# Patient Record
Sex: Female | Born: 1960 | Hispanic: Yes | Smoking: Never smoker
Health system: Southern US, Community
[De-identification: ages and names within clinical notes are randomized; demographics above are authoritative.]

## PROBLEM LIST (undated history)

## (undated) DIAGNOSIS — I219 Acute myocardial infarction, unspecified: Secondary | ICD-10-CM

## (undated) DIAGNOSIS — I10 Essential (primary) hypertension: Secondary | ICD-10-CM

## (undated) DIAGNOSIS — E079 Disorder of thyroid, unspecified: Secondary | ICD-10-CM

## (undated) DIAGNOSIS — J321 Chronic frontal sinusitis: Secondary | ICD-10-CM

## (undated) HISTORY — PX: BACK SURGERY: SHX140

## (undated) HISTORY — PX: APPENDECTOMY: SHX54

---

## 2017-10-04 ENCOUNTER — Ambulatory Visit
Admission: RE | Admit: 2017-10-04 | Discharge: 2017-10-04 | Disposition: A | Discharge status: Home / Self Care | Payer: Self-pay | Source: Ambulatory Visit | Attending: Oncology | Admitting: Oncology

## 2017-10-04 ENCOUNTER — Other Ambulatory Visit: Payer: Self-pay

## 2017-10-04 ENCOUNTER — Other Ambulatory Visit: Payer: Self-pay | Admitting: Oncology

## 2017-10-04 ENCOUNTER — Ambulatory Visit: Payer: Self-pay | Attending: Oncology | Admitting: *Deleted

## 2017-10-04 ENCOUNTER — Encounter: Payer: Self-pay | Admitting: *Deleted

## 2017-10-04 ENCOUNTER — Encounter (INDEPENDENT_AMBULATORY_CARE_PROVIDER_SITE_OTHER): Payer: Self-pay

## 2017-10-04 VITALS — BP 166/109 | HR 71 | Temp 97.5°F | Ht 62.0 in | Wt 155.0 lb

## 2017-10-04 DIAGNOSIS — Z0000 Encounter for general adult medical examination without abnormal findings: Secondary | ICD-10-CM | POA: Insufficient documentation

## 2017-10-04 IMAGING — MG MM DIGITAL SCREENING BILAT W/ TOMO W/ CAD
6 of 10 series · 6 of 30 positions shown · non-contrast
Comparison: None.

CLINICAL DATA: Screening.

EXAM:
DIGITAL SCREENING BILATERAL MAMMOGRAM WITH TOMO AND CAD

[L MLO synth-2D (1 of 2)]
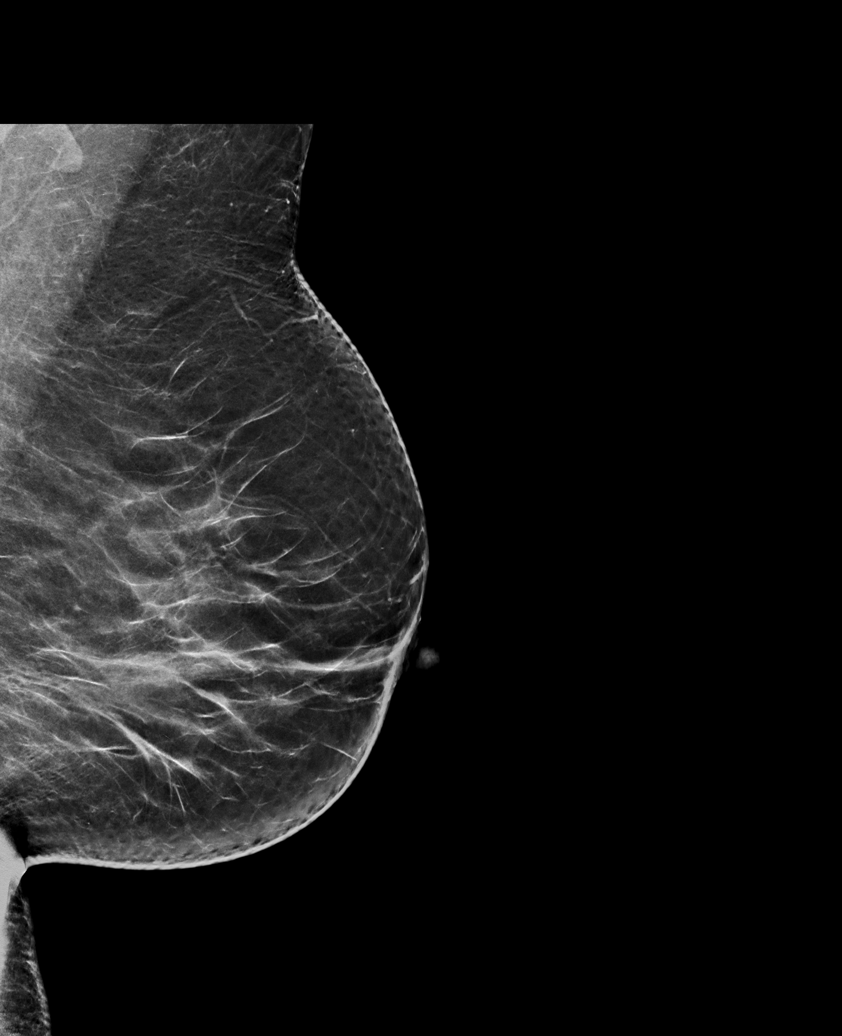

[R CC synth-2D]
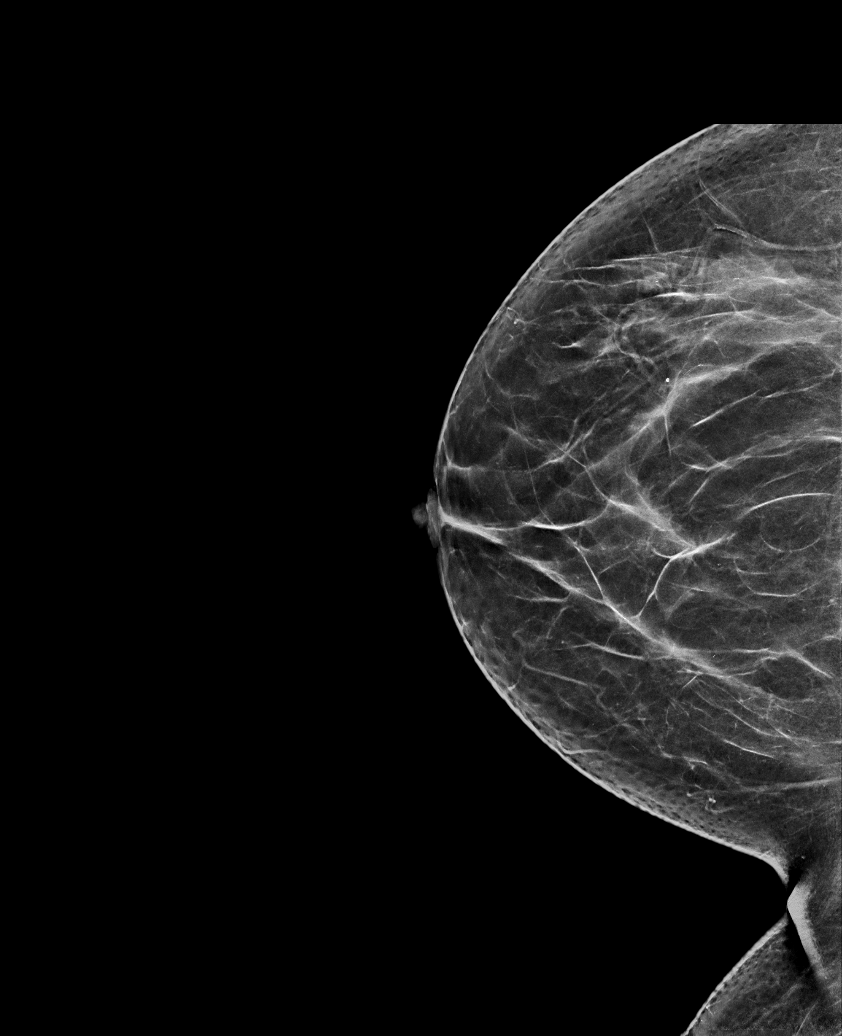

[L CC synth-2D]
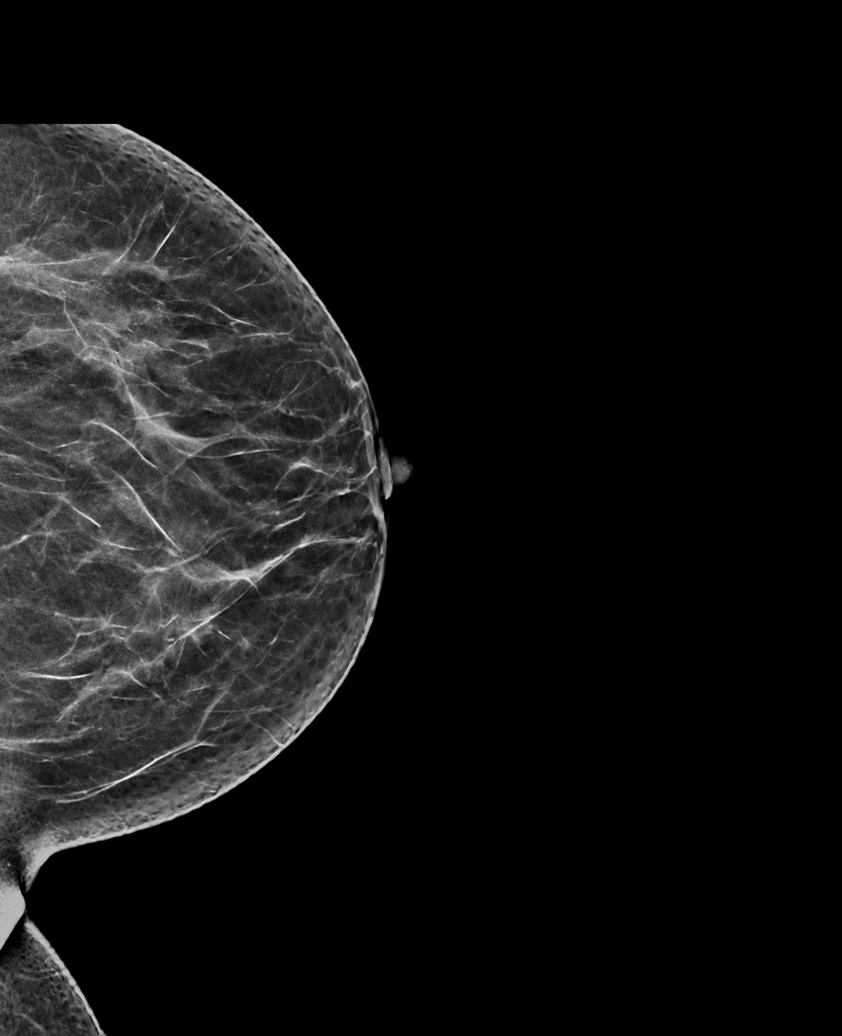

[L MLO synth-2D (2 of 2)]
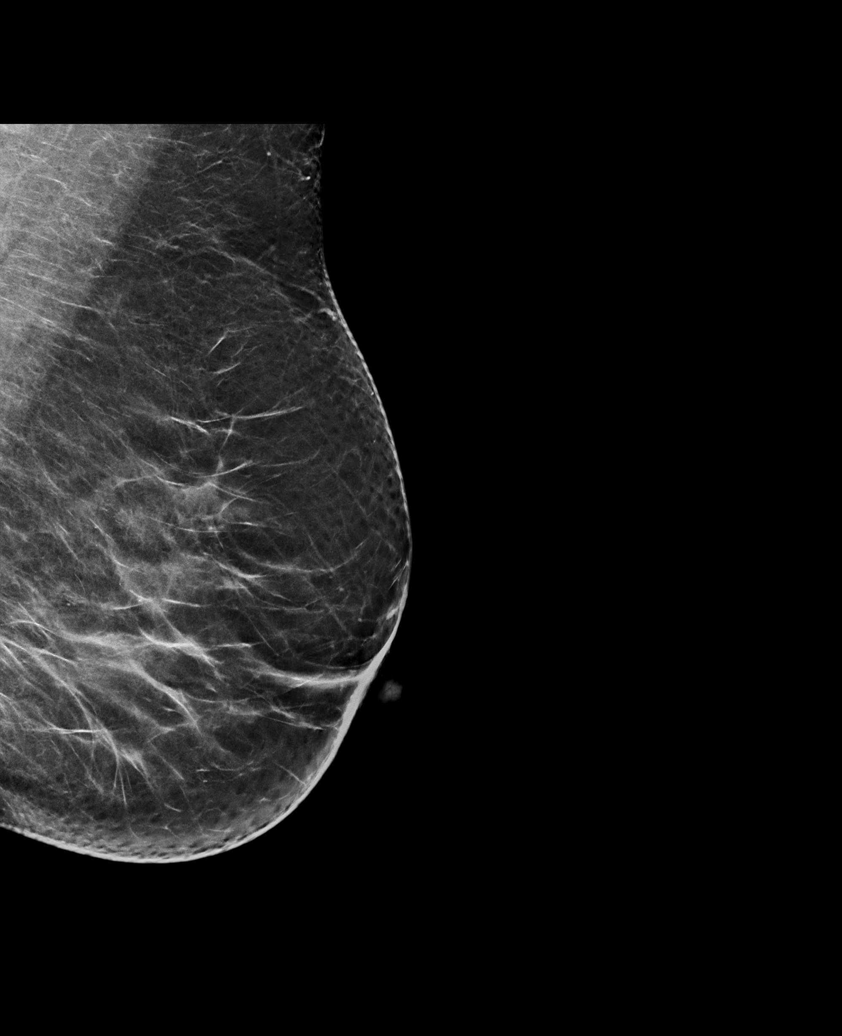

[R MLO synth-2D]
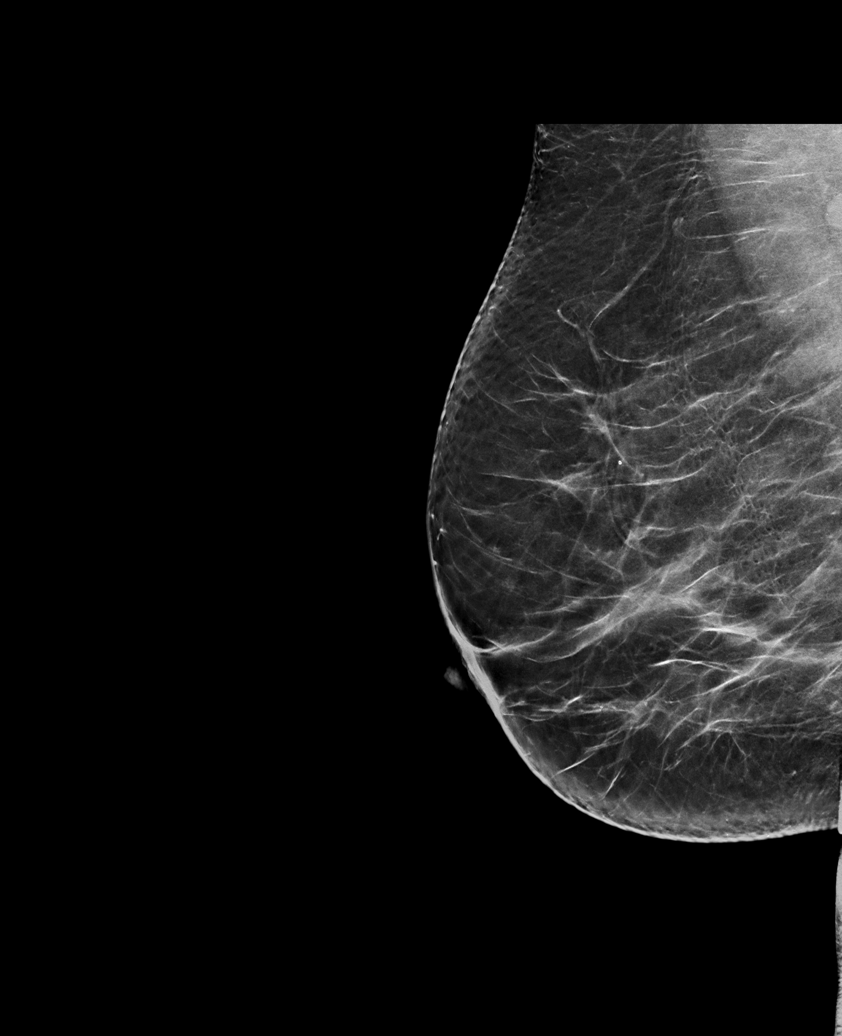

[L MLO tomo · tomo slice 37/74.0]
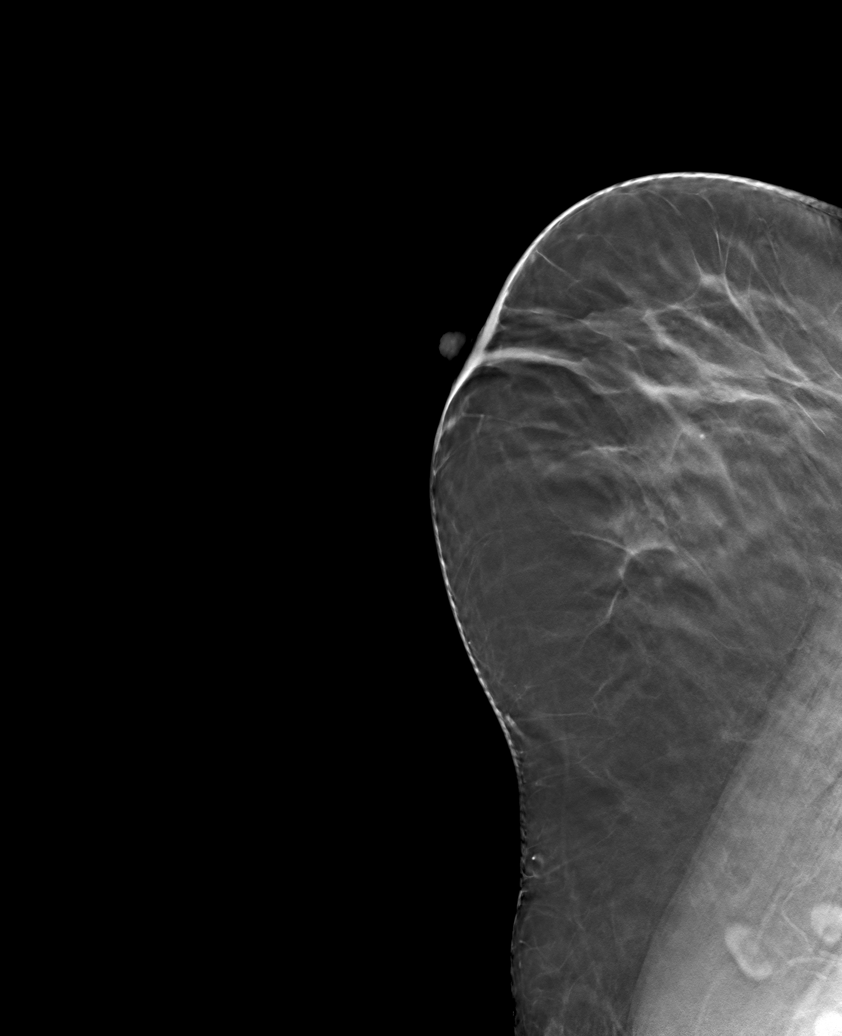

[6 of 30 positions shown; findings below may reference images not displayed]

ACR Breast Density Category c: The breast tissue is heterogeneously
dense, which may obscure small masses
FINDINGS: There are no findings suspicious for malignancy. Images were
processed with CAD.
IMPRESSION: No mammographic evidence of malignancy. A result letter of this
screening mammogram will be mailed directly to the patient.

RECOMMENDATION:
Screening mammogram in one year. (Code:[4W])

BI-RADS CATEGORY  1: Negative.

## 2017-10-04 NOTE — Progress Notes (Signed)
Letter mailed from the [facility] to inform patient of her normal mammogram results.  Patient is to follow-up with annual screening in one year.  HSIS to Christy. 

## 2017-10-04 NOTE — Patient Instructions (Signed)
Gave patient hand-out, Women Staying Healthy, Active and Well from [facility], with education on breast health, pap smears, heart and colon health. 

## 2017-10-04 NOTE — Progress Notes (Signed)
  Subjective:     Patient ID: Linda Clark [address], female   DOB: February 23, 1961, 57 y.o.   MRN: [id number]  HPI   Review of Systems     Objective:   Physical Exam  Pulmonary/Chest: Right breast exhibits no inverted nipple, no mass, no nipple discharge, no skin change and no tenderness. Left breast exhibits no inverted nipple, no mass, no nipple discharge, no skin change and no tenderness.       Assessment:     57 year old [place] female [facility] to [facility] clinical breast exam and mammogram only.  [name], the interpreter present during the interview and exam.  Patient states her last mammogram and pap smear were completed last year in [place].  States they were both normal.  On clinical breast exam there is no dominant mass, skin changes, nipple discharge or lymphadenopathy.  Taught self breast awareness.  Next pap due in 2021 since there is no pap for review.  Blood pressure elevated at  166/109.  She is to recheck her blood pressure at [facility] or [facility], and if remains higher than 140/90 she is to follow-up with her primary care provider.  Hand out on hypertention given to patient.Patient has been screened for eligibility.  She does not have any insurance, Medicare or Medicaid.  She also meets financial eligibility.  Hand-out given on the Affordable Care Act. Risk Assessment    Risk Scores      10/04/2017   Last edited by: Scarlett PrestoShaver, Anne F, RN   5-year risk: 0.8 %   Lifetime risk: 5.8 %             Plan:     Screening mammogram ordered.  Will follow-up per [facility] protocol.

## 2018-10-04 ENCOUNTER — Other Ambulatory Visit: Payer: Self-pay | Admitting: *Deleted

## 2018-10-04 DIAGNOSIS — Z20822 Contact with and (suspected) exposure to covid-19: Secondary | ICD-10-CM

## 2018-10-09 LAB — NOVEL CORONAVIRUS, NAA: SARS-CoV-2, NAA: NOT DETECTED

## 2020-03-25 ENCOUNTER — Ambulatory Visit
Admission: RE | Admit: 2020-03-25 | Discharge: 2020-03-25 | Disposition: A | Discharge status: Home / Self Care | Payer: Self-pay | Source: Ambulatory Visit | Attending: Oncology | Admitting: Oncology

## 2020-03-25 ENCOUNTER — Encounter (INDEPENDENT_AMBULATORY_CARE_PROVIDER_SITE_OTHER): Payer: Self-pay

## 2020-03-25 ENCOUNTER — Other Ambulatory Visit: Payer: Self-pay

## 2020-03-25 ENCOUNTER — Encounter: Payer: Self-pay | Admitting: *Deleted

## 2020-03-25 ENCOUNTER — Ambulatory Visit: Payer: Self-pay | Attending: Oncology | Admitting: *Deleted

## 2020-03-25 VITALS — BP 160/98 | HR 92 | Temp 98.6°F | Resp 20 | Wt 171.2 lb

## 2020-03-25 DIAGNOSIS — Z0000 Encounter for general adult medical examination without abnormal findings: Secondary | ICD-10-CM | POA: Insufficient documentation

## 2020-03-25 IMAGING — MG DIGITAL SCREENING BILAT W/ TOMO W/ CAD
8 series · 8 of 24 positions shown · non-contrast
Comparison: Previous exam(s).

CLINICAL DATA: Screening.

EXAM:
DIGITAL SCREENING BILATERAL MAMMOGRAM WITH TOMO AND CAD

[L MLO synth-2D]
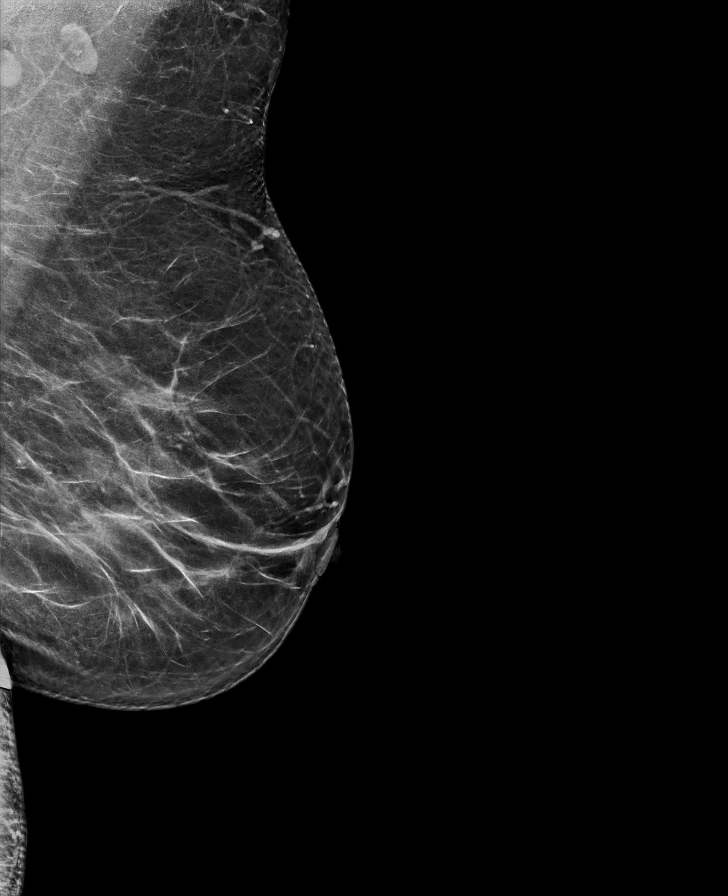

[R MLO synth-2D]
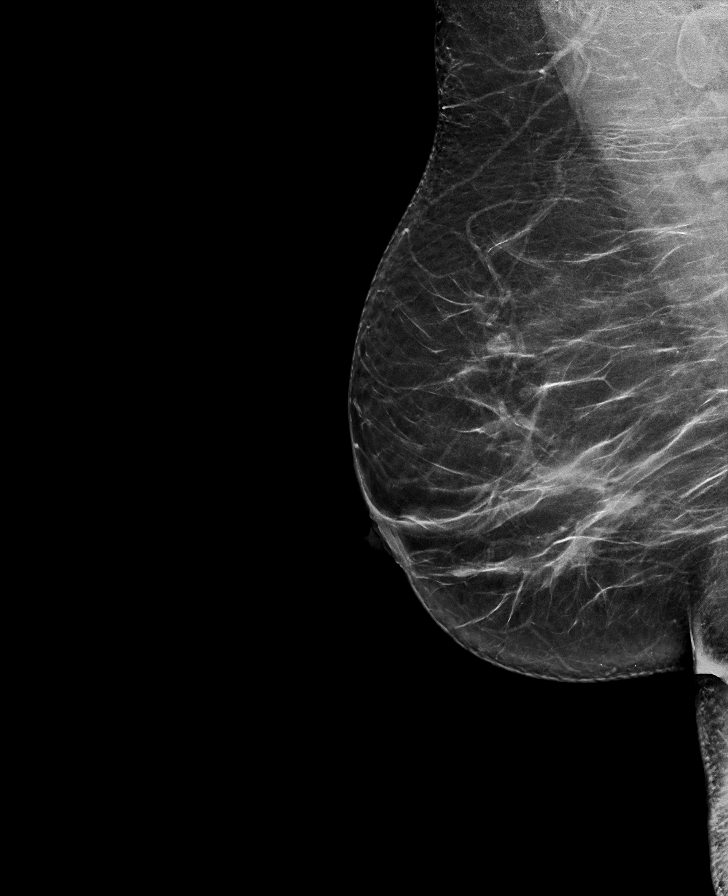

[L CC synth-2D]
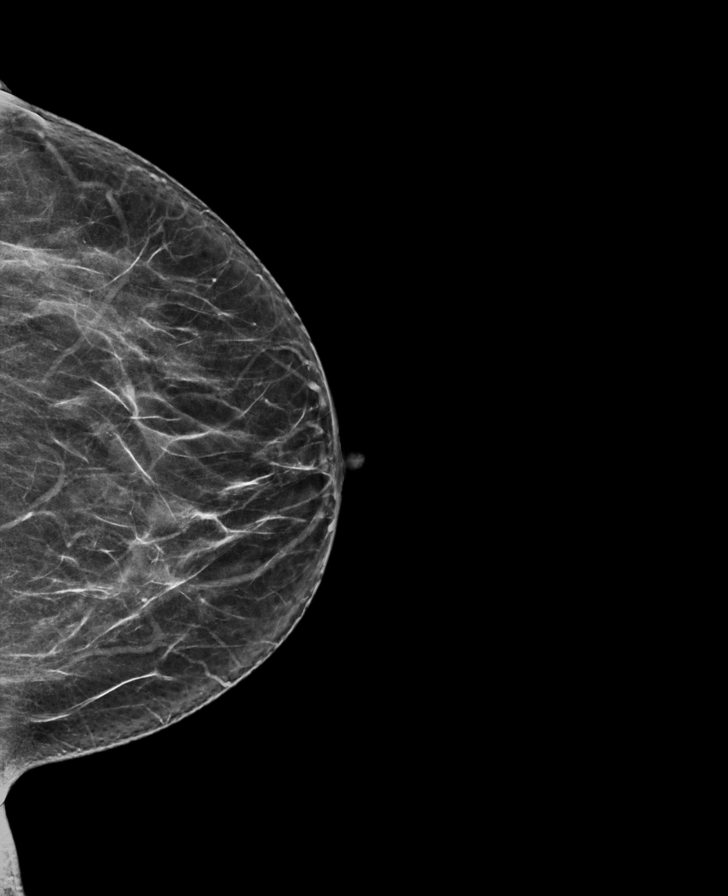

[R CC synth-2D]
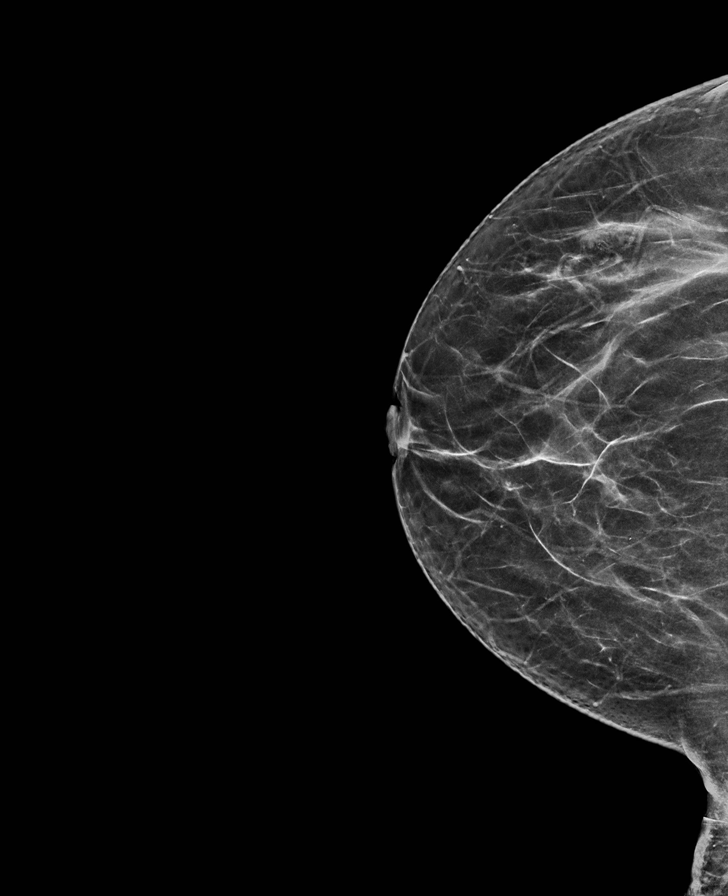

[L MLO tomo · tomo slice 35/70.0]
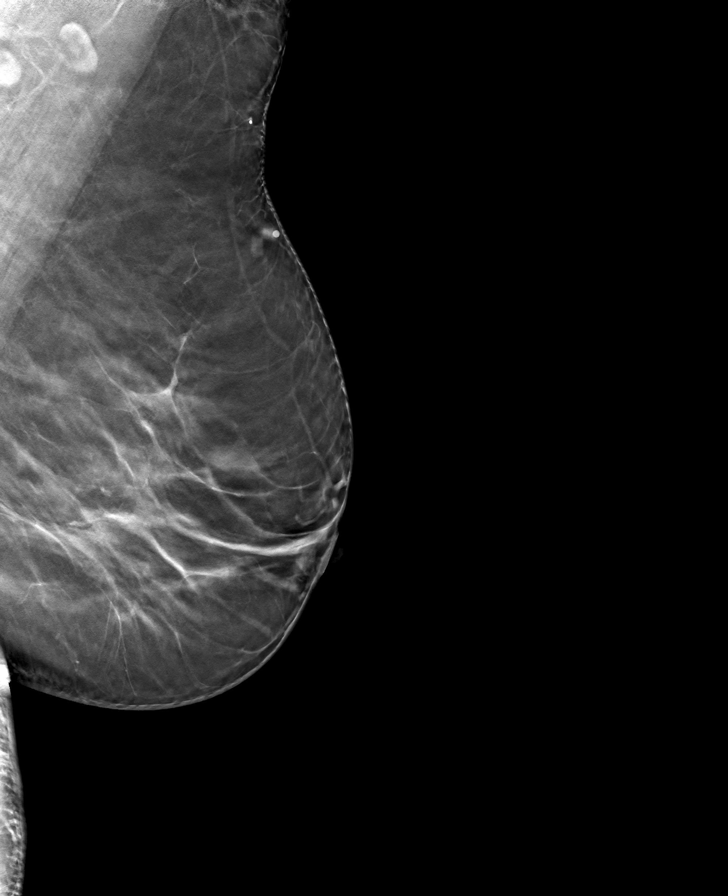

[R CC tomo · tomo slice 33/64.0]
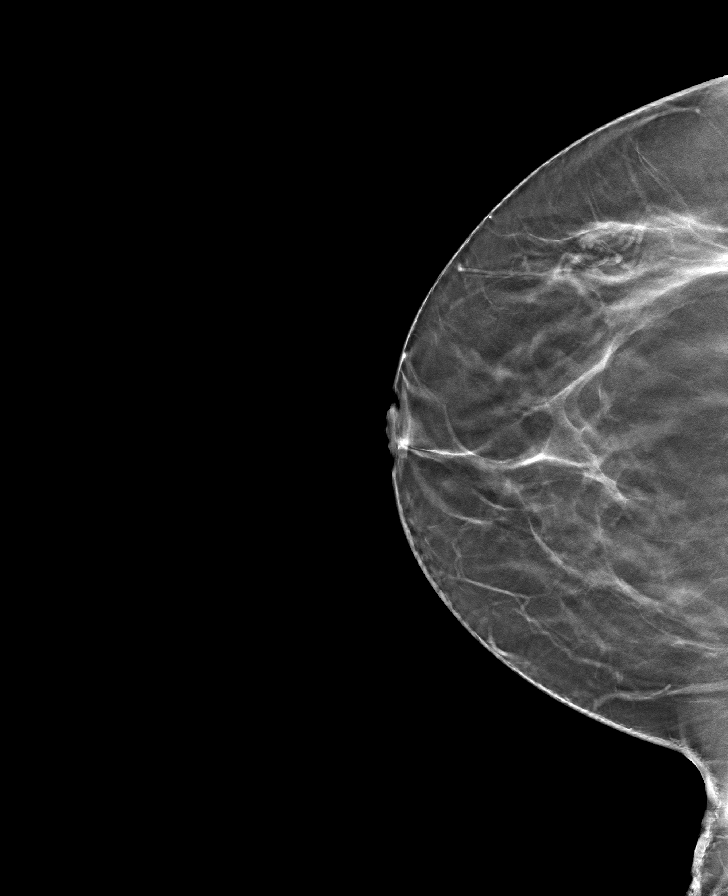

[R MLO tomo · tomo slice 37/74.0]
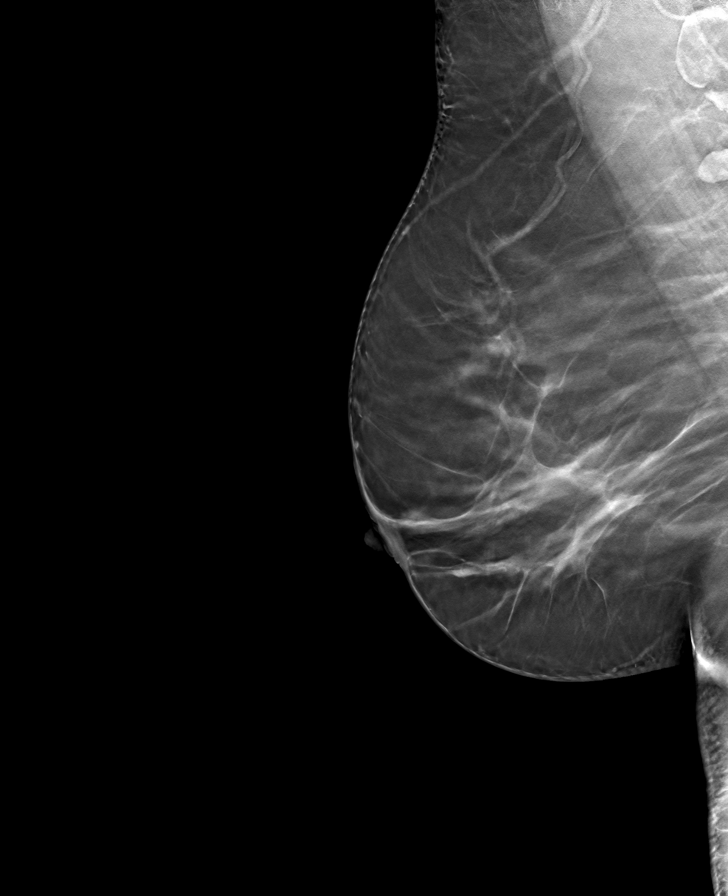

[L CC tomo · tomo slice 34/67.0]
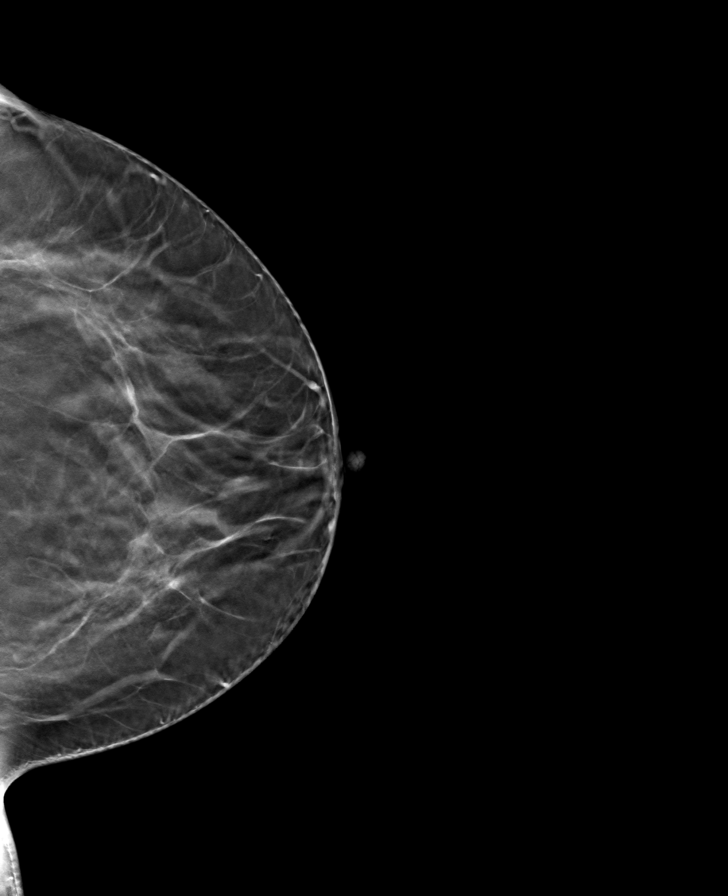

[8 of 24 positions shown; findings below may reference images not displayed]

ACR Breast Density Category b: There are scattered areas of
fibroglandular density.
FINDINGS: There are no findings suspicious for malignancy. Images were
processed with CAD.
IMPRESSION: No mammographic evidence of malignancy. A result letter of this
screening mammogram will be mailed directly to the patient.

RECOMMENDATION:
Screening mammogram in one year. (Code:[TQ])

BI-RADS CATEGORY  1: Negative.

## 2020-03-25 NOTE — Progress Notes (Signed)
  Subjective:     Patient ID: Linda Clark [address] female   DOB: 03/31/1960, 59 y.o.   MRN: [id number]  HPI  [facility] Medical History Record - 03/25/20 0858      Breast History   Screening cycle New    Initial Mammogram 03/25/20    Last Mammogram Date 10/04/17    Recent Breast Symptoms None      Breast Cancer History   Breast Cancer History No personal or family history    Comments/Details aunts abnd cousins on paternal side      Previous History of Breast Problems   Breast Surgery or Biopsy None    Breast Implants N/A    BSE Done Monthly      Gynecological/Obstetrical History   LMP --   2 years ago   Is there any chance that the client could be pregnant?  No    Age at menarche 10    Age at menopause 81    PAP smear history Annually    Date of last PAP  07/02/19    Age at first live birth 64    Breast fed children Yes (type length in comments)   69yrs   Cervical, Uterine or Ovarian cancer Yes   pre cancerous changes   Family history of Cervial, Uterine or Ovarian cancer Yes   sister   Hysterectomy No    Cervix removed No    Ovaries removed No    Laser/Cryosurgery No    Current method of birth control None    Current method of Estrogen/Hormone replacement None    Smoking history None            Review of Systems     Objective:   Physical Exam Chest:  Breasts:     Right: No swelling, bleeding, inverted nipple, mass, nipple discharge, skin change, tenderness, axillary adenopathy or supraclavicular adenopathy.     Left: No swelling, bleeding, inverted nipple, mass, nipple discharge, skin change, tenderness, axillary adenopathy or supraclavicular adenopathy.     Lymphadenopathy:     Upper Body:     Right upper body: No supraclavicular or axillary adenopathy.     Left upper body: No supraclavicular or axillary adenopathy.        Assessment:     59 year old Hispanic female returns to [facility] for annual screening.  Linda Clark [id number] with [employer]  interpreters present via video during the interview and exam.  Clinical breast exam without dominant mass, skin changes, nipple discharge or lymphadenopathy.   Right nipple slightly flatter than the left. Taught self breast awareness.  Last pap in April 2021 at the [facility] clinic.  Those results are not available for review.  Patient has been screened for eligibility.  She does not have any insurance, Medicare or Medicaid.  She also meets financial eligibility.   Risk Assessment   No risk assessment data for the current encounter  Risk Scores      10/04/2017   Last edited by: Scarlett Presto, RN   5-year risk: 0.8 %   Lifetime risk: 5.8 %        Tyrer-Cuzick breast cancer risk assessment with a lifetime risk of 7.8%.  Per NCCN guidelines no modified imaging or genetic testing is recommended.    Plan:     Screening mammogram ordered.  Will follow up per [facility] protocol.

## 2020-03-25 NOTE — Patient Instructions (Signed)
Gave patient hand-out, Women Staying Healthy, Active and Well from [facility], with education on breast health, pap smears, heart and colon health. 

## 2020-04-08 ENCOUNTER — Encounter: Payer: Self-pay | Admitting: *Deleted

## 2020-04-08 NOTE — Progress Notes (Signed)
Letter mailed from the [facility] to inform patient of her normal mammogram results.  Patient is to follow-up with annual screening in one year. 

## 2021-02-17 ENCOUNTER — Emergency Department: Payer: Self-pay

## 2021-02-17 ENCOUNTER — Other Ambulatory Visit: Payer: Self-pay

## 2021-02-17 ENCOUNTER — Emergency Department
Admission: EM | Admit: 2021-02-17 | Discharge: 2021-02-17 | Disposition: A | Discharge status: Home / Self Care | Payer: Self-pay | Attending: Emergency Medicine | Admitting: Emergency Medicine

## 2021-02-17 DIAGNOSIS — I160 Hypertensive urgency: Secondary | ICD-10-CM | POA: Insufficient documentation

## 2021-02-17 DIAGNOSIS — I2510 Atherosclerotic heart disease of native coronary artery without angina pectoris: Secondary | ICD-10-CM | POA: Insufficient documentation

## 2021-02-17 DIAGNOSIS — Z20822 Contact with and (suspected) exposure to covid-19: Secondary | ICD-10-CM | POA: Insufficient documentation

## 2021-02-17 DIAGNOSIS — Z7982 Long term (current) use of aspirin: Secondary | ICD-10-CM | POA: Insufficient documentation

## 2021-02-17 DIAGNOSIS — Y900 Blood alcohol level of less than 20 mg/100 ml: Secondary | ICD-10-CM | POA: Insufficient documentation

## 2021-02-17 DIAGNOSIS — Z79899 Other long term (current) drug therapy: Secondary | ICD-10-CM | POA: Insufficient documentation

## 2021-02-17 DIAGNOSIS — I10 Essential (primary) hypertension: Secondary | ICD-10-CM | POA: Insufficient documentation

## 2021-02-17 HISTORY — DX: Disorder of thyroid, unspecified: E07.9

## 2021-02-17 HISTORY — DX: Chronic frontal sinusitis: J32.1

## 2021-02-17 HISTORY — DX: Essential (primary) hypertension: I10

## 2021-02-17 HISTORY — DX: Acute myocardial infarction, unspecified: I21.9

## 2021-02-17 LAB — URINALYSIS, ROUTINE W REFLEX MICROSCOPIC
Bilirubin Urine: NEGATIVE
Glucose, UA: NEGATIVE mg/dL
Hgb urine dipstick: NEGATIVE
Ketones, ur: NEGATIVE mg/dL
Leukocytes,Ua: NEGATIVE
Nitrite: NEGATIVE
Protein, ur: NEGATIVE mg/dL
Specific Gravity, Urine: 1.034 — ABNORMAL HIGH (ref 1.005–1.030)
pH: 8 (ref 5.0–8.0)

## 2021-02-17 LAB — CSF CELL COUNT WITH DIFFERENTIAL
Eosinophils, CSF: 0 %
Lymphs, CSF: 90 %
Monocyte-Macrophage-Spinal Fluid: 10 %
RBC Count, CSF: 0 /mm3 (ref 0–3)
Segmented Neutrophils-CSF: 0 %
Tube #: 3
WBC, CSF: 3 /mm3 (ref 0–5)

## 2021-02-17 LAB — COMPREHENSIVE METABOLIC PANEL
ALT: 35 U/L (ref 0–44)
AST: 54 U/L — ABNORMAL HIGH (ref 15–41)
Albumin: 4.5 g/dL (ref 3.5–5.0)
Alkaline Phosphatase: 65 U/L (ref 38–126)
Anion gap: 8 (ref 5–15)
BUN: 17 mg/dL (ref 6–20)
CO2: 28 mmol/L (ref 22–32)
Calcium: 9.5 mg/dL (ref 8.9–10.3)
Chloride: 102 mmol/L (ref 98–111)
Creatinine, Ser: 0.55 mg/dL (ref 0.44–1.00)
GFR, Estimated: >60 mL/min (ref >60)
Glucose, Bld: 111 mg/dL — ABNORMAL HIGH (ref 70–99)
Potassium: 4.4 mmol/L (ref 3.5–5.1)
Sodium: 138 mmol/L (ref 135–145)
Total Bilirubin: 0.9 mg/dL (ref 0.3–1.2)
Total Protein: 8 g/dL (ref 6.5–8.1)

## 2021-02-17 LAB — CBC WITH DIFFERENTIAL/PLATELET
Abs Immature Granulocytes: 0.03 10*3/uL (ref 0.00–0.07)
Basophils Absolute: 0.1 10*3/uL (ref 0.0–0.1)
Basophils Relative: 1 %
Eosinophils Absolute: 0.3 10*3/uL (ref 0.0–0.5)
Eosinophils Relative: 3 %
HCT: 49.6 % — ABNORMAL HIGH (ref 36.0–46.0)
Hemoglobin: 16 g/dL — ABNORMAL HIGH (ref 12.0–15.0)
Immature Granulocytes: 0 %
Lymphocytes Relative: 18 %
Lymphs Abs: 1.6 10*3/uL (ref 0.7–4.0)
MCH: 29.9 pg (ref 26.0–34.0)
MCHC: 32.3 g/dL (ref 30.0–36.0)
MCV: 92.5 fL (ref 80.0–100.0)
Monocytes Absolute: 0.6 10*3/uL (ref 0.1–1.0)
Monocytes Relative: 6 %
Neutro Abs: 6.4 10*3/uL (ref 1.7–7.7)
Neutrophils Relative %: 72 %
Platelets: 301 10*3/uL (ref 150–400)
RBC: 5.36 MIL/uL — ABNORMAL HIGH (ref 3.87–5.11)
RDW: 12.8 % (ref 11.5–15.5)
WBC: 8.9 10*3/uL (ref 4.0–10.5)
nRBC: 0 % (ref 0.0–0.2)

## 2021-02-17 LAB — URINE DRUG SCREEN, QUALITATIVE (ARMC ONLY)
Amphetamines, Ur Screen: NOT DETECTED
Barbiturates, Ur Screen: NOT DETECTED
Benzodiazepine, Ur Scrn: NOT DETECTED
Cannabinoid 50 Ng, Ur ~~LOC~~: NOT DETECTED
Cocaine Metabolite,Ur ~~LOC~~: NOT DETECTED
MDMA (Ecstasy)Ur Screen: NOT DETECTED
Methadone Scn, Ur: NOT DETECTED
Opiate, Ur Screen: NOT DETECTED
Phencyclidine (PCP) Ur S: NOT DETECTED
Tricyclic, Ur Screen: NOT DETECTED

## 2021-02-17 LAB — RESP PANEL BY RT-PCR (FLU A&B, COVID) ARPGX2
Influenza A by PCR: NEGATIVE
Influenza B by PCR: NEGATIVE
SARS Coronavirus 2 by RT PCR: NEGATIVE

## 2021-02-17 LAB — PROTEIN AND GLUCOSE, CSF
Glucose, CSF: 65 mg/dL (ref 40–70)
Total  Protein, CSF: 33 mg/dL (ref 15–45)

## 2021-02-17 LAB — APTT: aPTT: 27 seconds (ref 24–36)

## 2021-02-17 LAB — PROTIME-INR
INR: 0.9 (ref 0.8–1.2)
Prothrombin Time: 12.6 seconds (ref 11.4–15.2)

## 2021-02-17 LAB — ETHANOL: Alcohol, Ethyl (B): <10 mg/dL (ref <10)

## 2021-02-17 LAB — TROPONIN I (HIGH SENSITIVITY): Troponin I (High Sensitivity): 15 ng/L (ref <18)

## 2021-02-17 IMAGING — CT CT ANGIO HEAD-NECK (W OR W/O PERF)
3 of 7 series · 10 of 36 positions shown · IV contrast (APPLIED)
Comparison: Same-day noncontrast CT head.

CLINICAL DATA: Same-day noncontrast CT head

EXAM:
CT ANGIOGRAPHY HEAD AND NECK
TECHNIQUE: Multidetector CT imaging of the head and neck was performed using
the standard protocol during bolus administration of intravenous
contrast. Multiplanar CT image reconstructions and MIPs were
obtained to evaluate the vascular anatomy. Carotid stenosis
measurements (when applicable) are obtained utilizing NASCET
criteria, using the distal internal carotid diameter as the
denominator.
CONTRAST:  125mL OMNIPAQUE IOHEXOL 350 MG/ML SOLN

[Series 4: cta head neck · axial · 0.46mm/px · z∈[-206,-102]mm · 2 of 157 slices shown]
[im 53/157  soft-tissue]
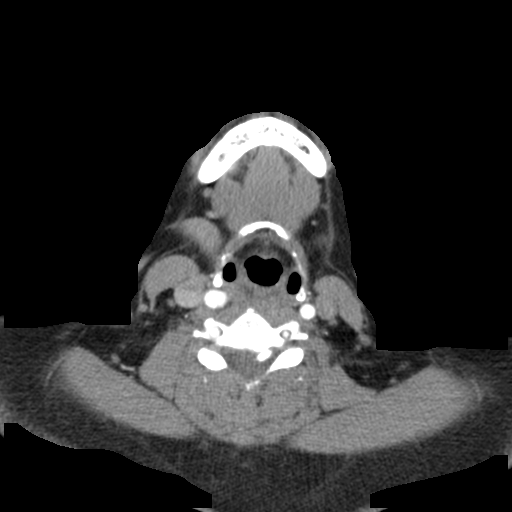
[im 105/157  soft-tissue]
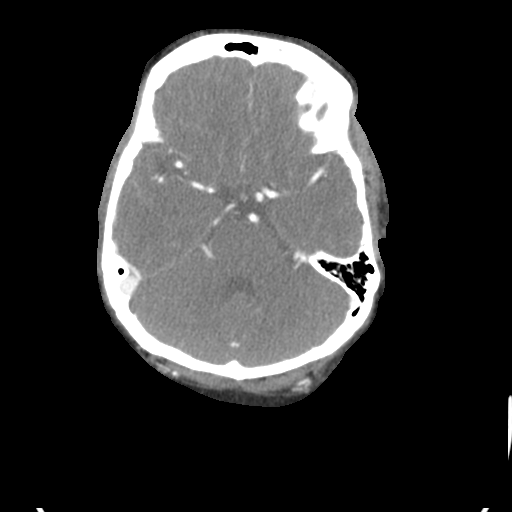

[Series 7: ax thin · axial · 0.53mm/px · z∈[-286,-56]mm · 6 of 323 slices shown]
[im 47/323  soft-tissue]
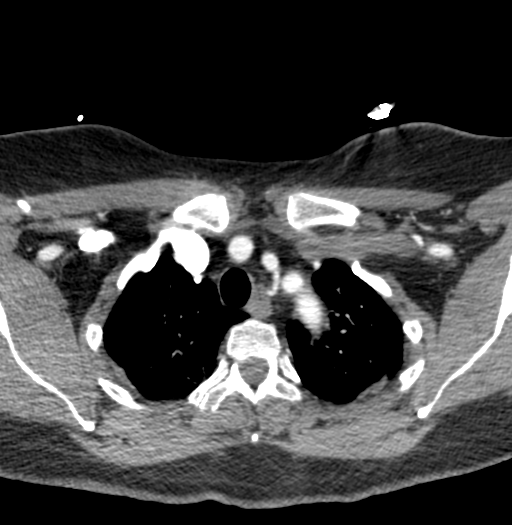
[im 93/323  bone]
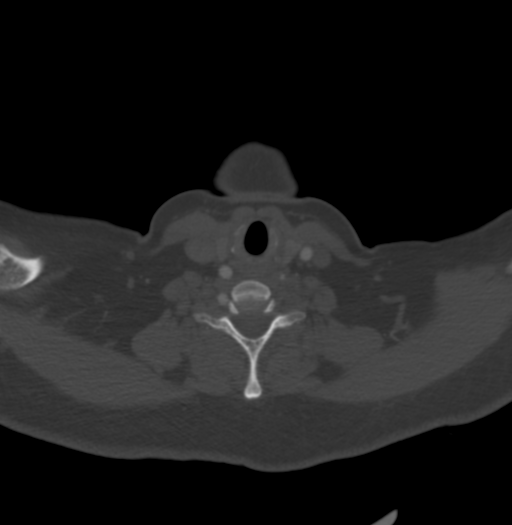
[im 139/323  soft-tissue]
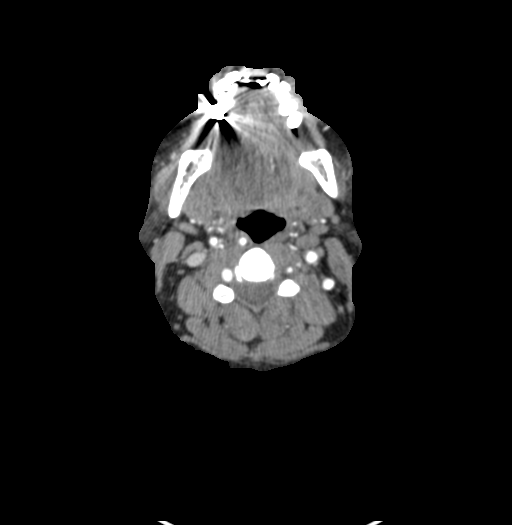
[im 185/323  bone]
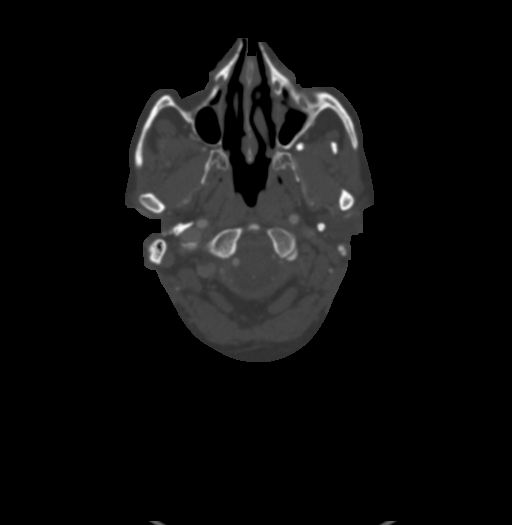
[im 231/323  soft-tissue]
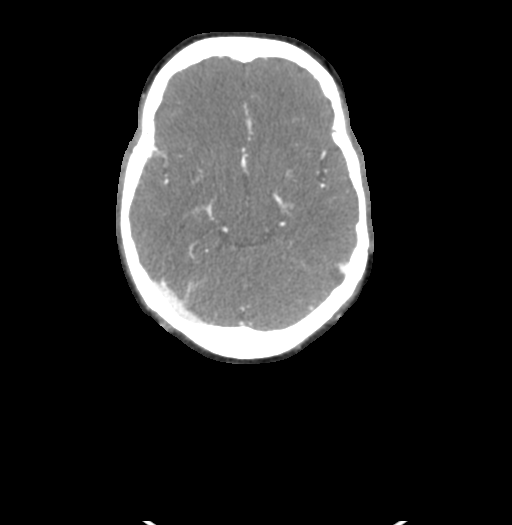
[im 277/323  bone]
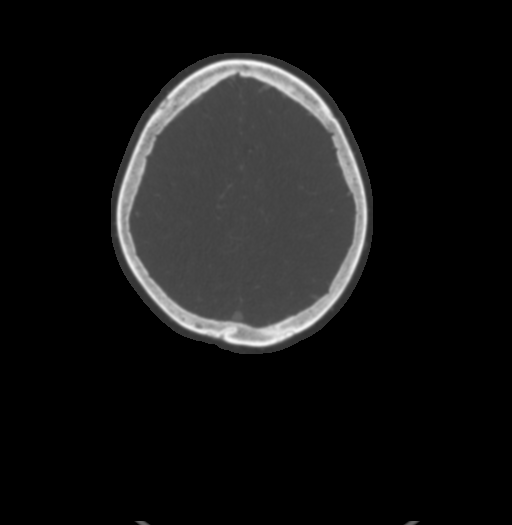

[Series 11: sagittal thin · sagittal · 0.47mm/px · 2 of 271 slices shown]
[im 9/271  soft-tissue]
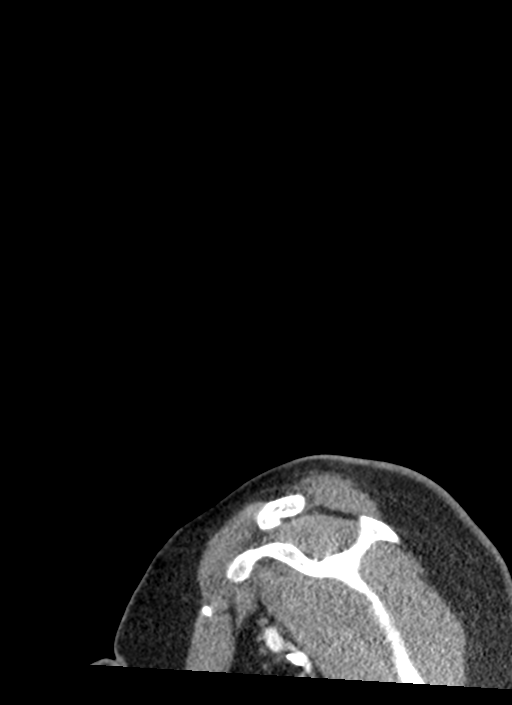
[im 263/271  soft-tissue]
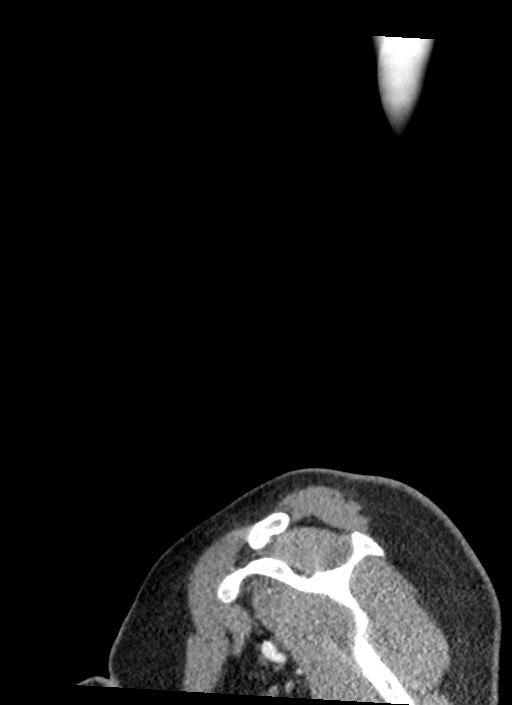

[10 of 36 positions shown; findings below may reference images not displayed]

FINDINGS: CTA NECK FINDINGS

Aortic arch: The left vertebral artery arises directly from the
aortic arch, a normal variant. Imaged portion shows no evidence of
aneurysm or dissection. No significant stenosis of the major arch
vessel origins.

Right carotid system: The right common, internal, and external
carotid arteries are patent, without hemodynamically significant
stenosis, occlusion, dissection, or aneurysm. The right internal
carotid artery has a medialized retropharyngeal course.

Left carotid system: The left common, internal, and external carotid
arteries are patent, without hemodynamically significant stenosis,
occlusion, dissection, or aneurysm.

Vertebral arteries: The right vertebral artery is dominant with a
diminutive left vertebral artery., a normal variant. The vertebral
arteries are patent, without hemodynamically significant stenosis,
occlusion, dissection, or aneurysm.

Skeleton: There is mild degenerative change at C5-C6. There is no
visible canal hematoma. There is no acute osseous abnormality or
aggressive osseous lesion.

Other neck: The soft tissues are unremarkable.

Upper chest: The imaged lung apices are clear.

Review of the MIP images confirms the above findings

CTA HEAD FINDINGS

Anterior circulation: The bilateral cavernous ICAs are patent with
minimal plaque in the left supraclinoid segment.

The bilateral MCAs are patent.

The bilateral ACAs are patent.

There is no aneurysm.

Posterior circulation: The left V4 segment is markedly diminutive
after the PICA origin, likely developmental variant. The prominent
right V4 segment is patent. The basilar artery is patent.

The bilateral PCAs are patent.

There is no aneurysm.

Venous sinuses: Patent.

Anatomic variants: None.

Review of the MIP images confirms the above findings
IMPRESSION: 1. No aneurysm identified.
2. Patent vasculature of the head and neck, with no hemodynamically
significant stenosis, occlusion, or dissection.

## 2021-02-17 IMAGING — CT CT ANGIO CHEST-ABD-PELV FOR DISSECTION W/ AND WO/W CM
2 of 7 series · 11 of 36 positions shown, 17 images · IV contrast (APPLIED)
Comparison: None.

CLINICAL DATA: Hypertension. Small subarachnoid hemorrhage on head
CT.

EXAM:
CT ANGIOGRAPHY CHEST, ABDOMEN AND PELVIS
TECHNIQUE: Non-contrast CT of the chest was initially obtained.

[Series 6: axial arterial · axial · arterial · 0.66mm/px · z∈[-784,-268]mm · 10 of 204 slices shown, 15 images]
[im 16/204  mediastinal]
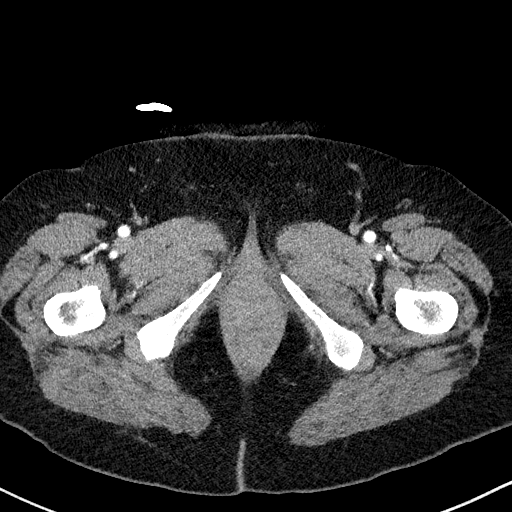
[im 16/204  bone]
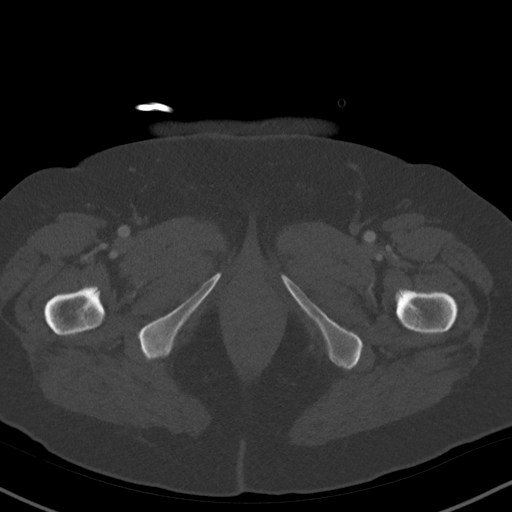
[im 47/204  mediastinal]
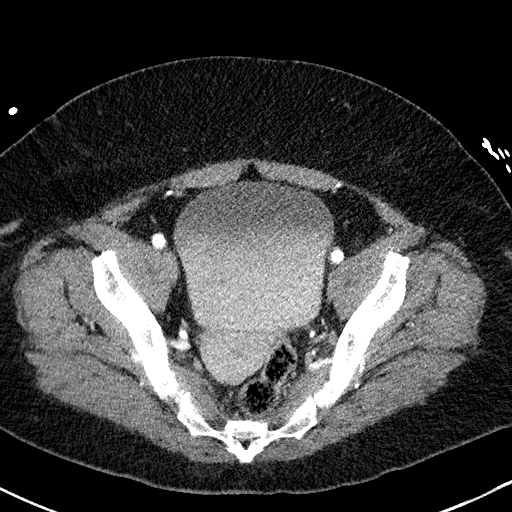
[im 63/204  mediastinal]
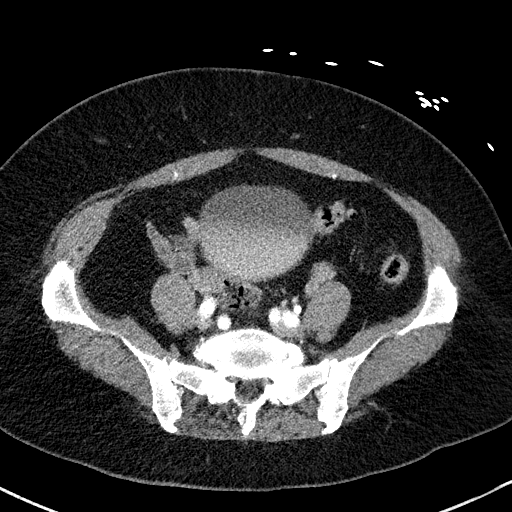
[im 79/204  mediastinal]
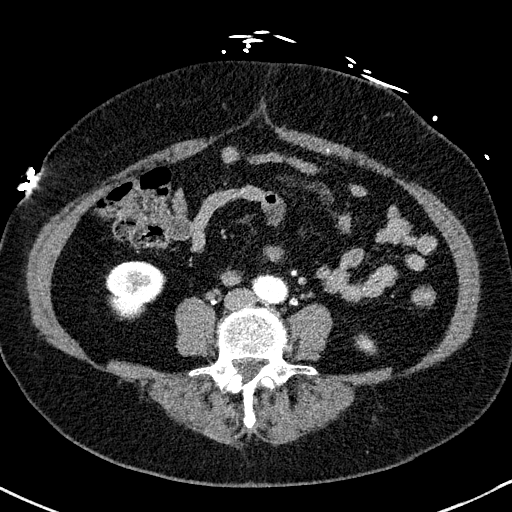
[im 110/204  mediastinal]
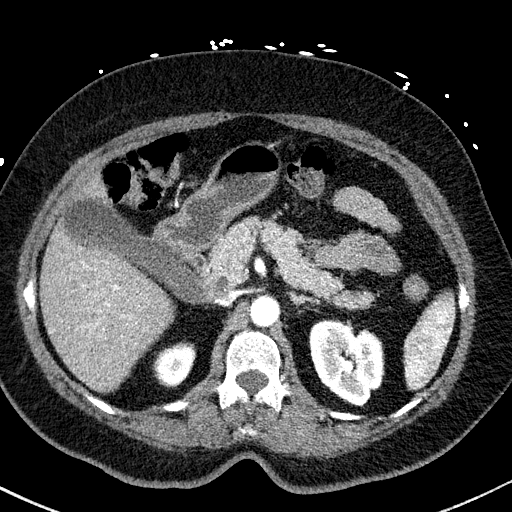
[im 125/204  mediastinal]
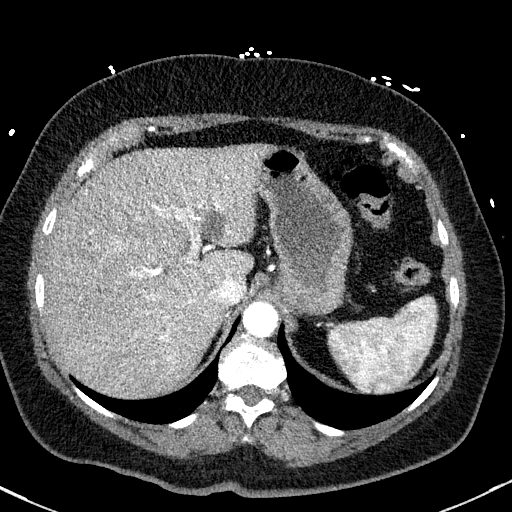
[im 141/204  mediastinal]
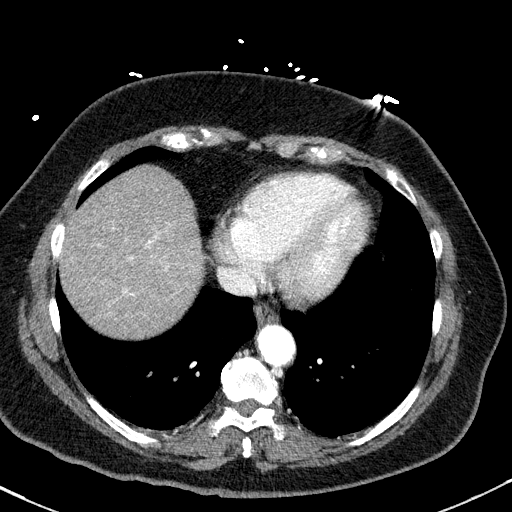
[im 141/204  lung]
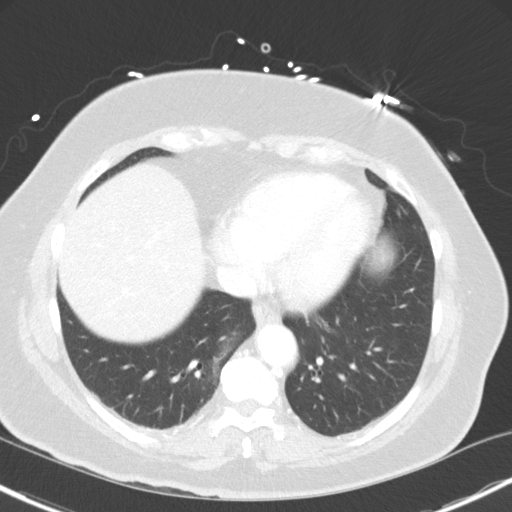
[im 157/204  lung]
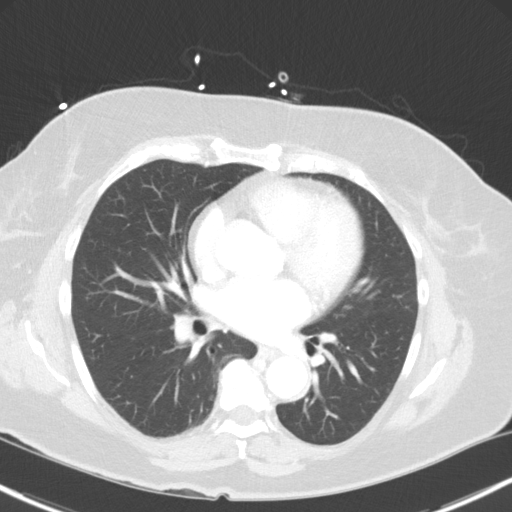
[im 172/204  mediastinal]
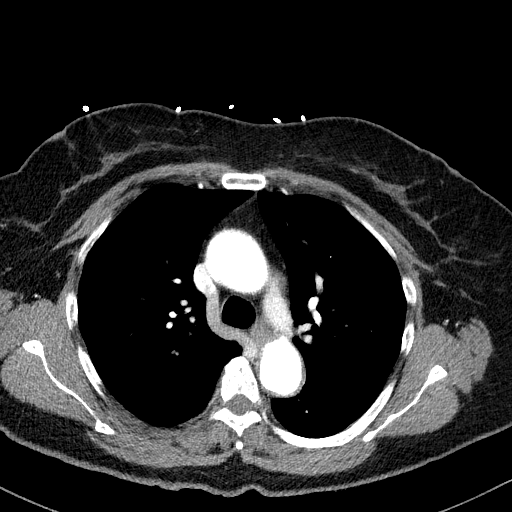
[im 172/204  lung]
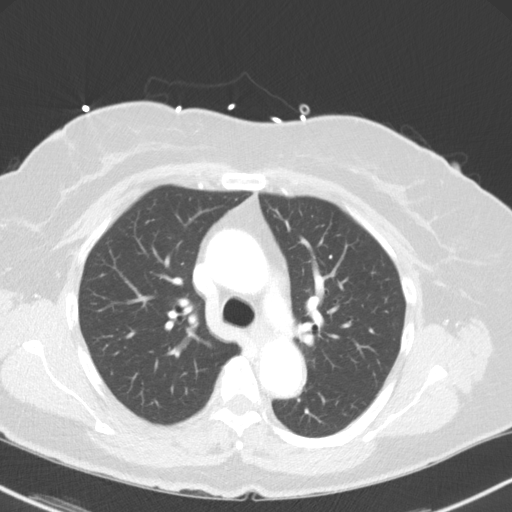
[im 188/204  mediastinal]
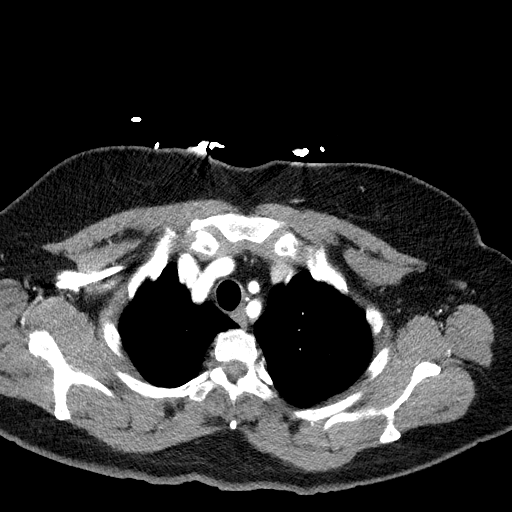
[im 188/204  lung]
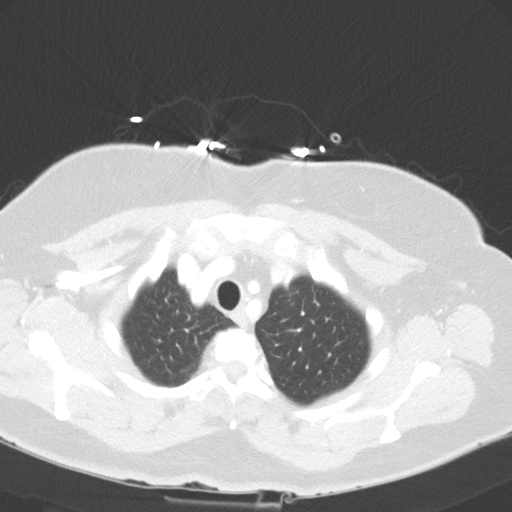
[im 188/204  bone]
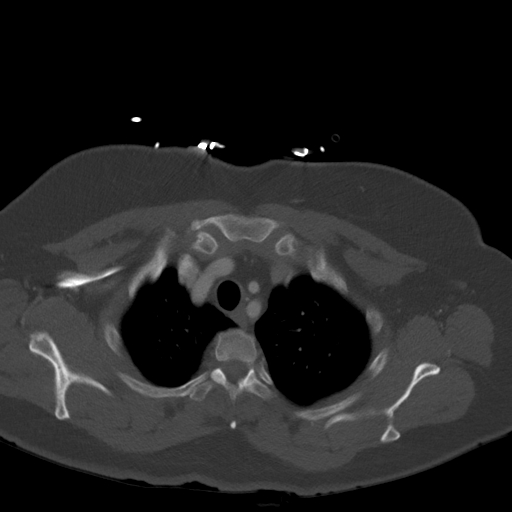

[Series 9: coronals · coronal · 0.79mm/px · 1 of 149 slices shown, 2 images]
[im 75/149  mediastinal]
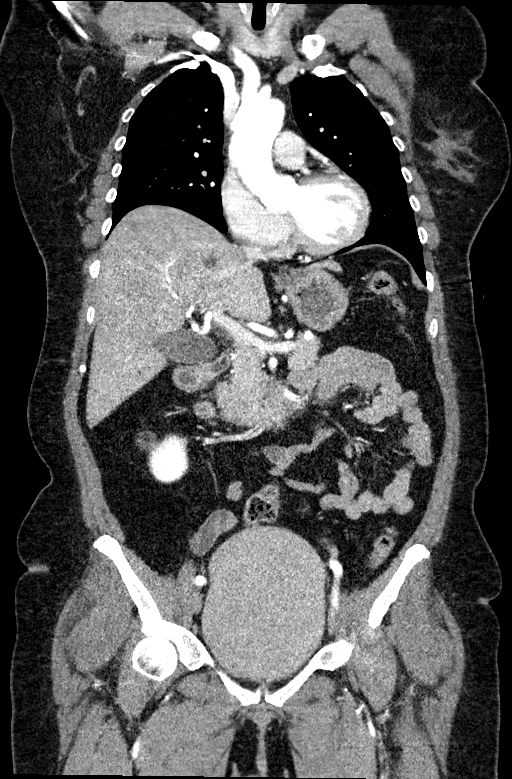
[im 75/149  bone]
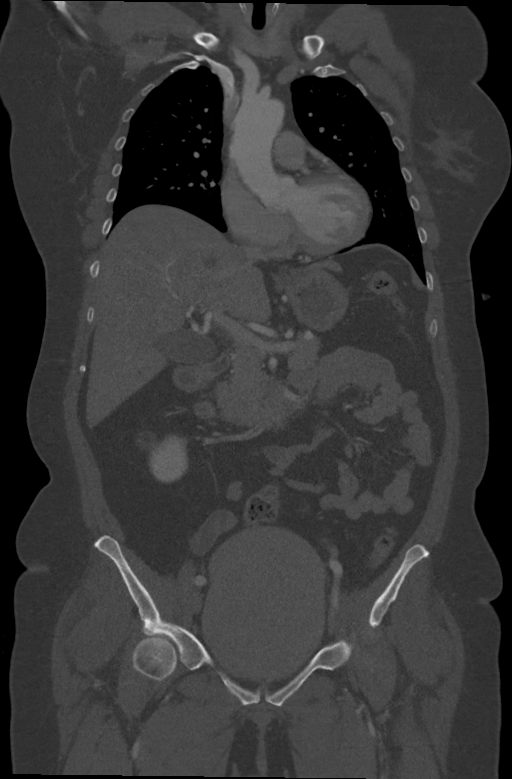

[11 of 36 positions shown; findings below may reference images not displayed]

Multidetector CT imaging through the chest, abdomen and pelvis was
performed using the standard protocol during bolus administration of
intravenous contrast. Multiplanar reconstructed images and MIPs were
obtained and reviewed to evaluate the vascular anatomy.

CONTRAST:  125mL OMNIPAQUE IOHEXOL 350 MG/ML SOLN
FINDINGS: CTA CHEST FINDINGS

Cardiovascular: Preferential opacification of the thoracic aorta. No
evidence of thoracic aortic aneurysm or dissection. Normal heart
size. No pericardial effusion. No central pulmonary embolism.

Mediastinum/Nodes: No enlarged mediastinal, hilar, or axillary lymph
nodes. Thyroid gland, trachea, and esophagus demonstrate no
significant findings.

Lungs/Pleura: Lungs are clear. No pleural effusion or pneumothorax.

Musculoskeletal: No chest wall abnormality. No acute or significant
osseous findings.

Review of the MIP images confirms the above findings.

CTA ABDOMEN AND PELVIS FINDINGS

VASCULAR

Aorta: Normal caliber aorta without aneurysm, dissection, vasculitis
or significant stenosis. Mild atherosclerotic calcification.

Celiac: Patent without evidence of aneurysm, dissection, vasculitis
or significant stenosis.

SMA: Patent without evidence of aneurysm, dissection, vasculitis or
significant stenosis.

Renals: Both renal arteries are patent without evidence of aneurysm,
dissection, vasculitis, fibromuscular dysplasia or significant
stenosis.

IMA: Patent without evidence of aneurysm, dissection, vasculitis or
significant stenosis.

Inflow: Patent without evidence of aneurysm, dissection, vasculitis
or significant stenosis.

Veins: No obvious venous abnormality within the limitations of this
arterial phase study.

Review of the MIP images confirms the above findings.

NON-VASCULAR

Hepatobiliary: Scattered hepatic simple cysts measuring up to
cm. Other subcentimeter low-density lesions within the liver are too
small to characterize. Gallbladder sludge. No gallbladder wall
thickening or biliary dilatation.

Pancreas: Unremarkable. No pancreatic ductal dilatation or
surrounding inflammatory changes.

Spleen: Normal in size without focal abnormality.

Adrenals/Urinary Tract: Adrenal glands are unremarkable. Bilateral
renal simple cysts measuring up to 2.2 cm. No renal calculi or
hydronephrosis. The bladder is unremarkable.

Stomach/Bowel: Small hiatal hernia. The stomach is otherwise within
normal limits. No bowel wall thickening, distention, or surrounding
inflammatory changes.

Lymphatic: No enlarged abdominal or pelvic lymph nodes.

Reproductive: Uterus and bilateral adnexa are unremarkable.

Other: No abdominal wall hernia or abnormality. No abdominopelvic
ascites. No pneumoperitoneum.

Musculoskeletal: No acute or significant osseous findings.

Review of the MIP images confirms the above findings.
IMPRESSION: 1. No evidence of acute aortic syndrome. No aneurysm.
2. Gallbladder sludge.
3.  Aortic atherosclerosis ([RJ]-[RJ]).

## 2021-02-17 IMAGING — CT CT HEAD W/O CM
4 series · 16 of 47 positions shown, 18 images · non-contrast
Comparison: None.

CLINICAL DATA: Headache

EXAM:
CT HEAD WITHOUT CONTRAST
TECHNIQUE: Contiguous axial images were obtained from the base of the skull
through the vertex without intravenous contrast.

[Series 2: head bone · axial · 0.41mm/px · z∈[-131,-101]mm · 3 of 77 slices shown]
[im 8/77  bone]
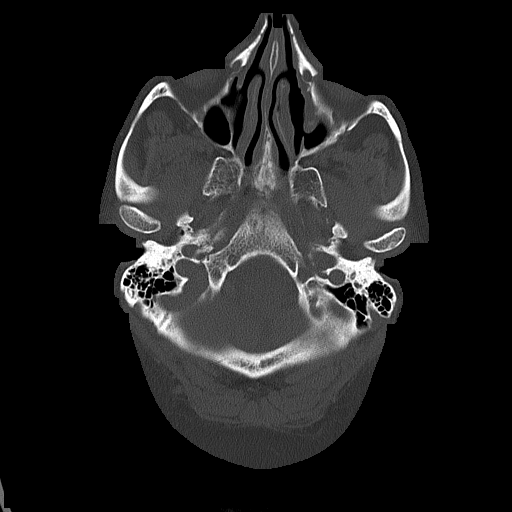
[im 16/77  bone]
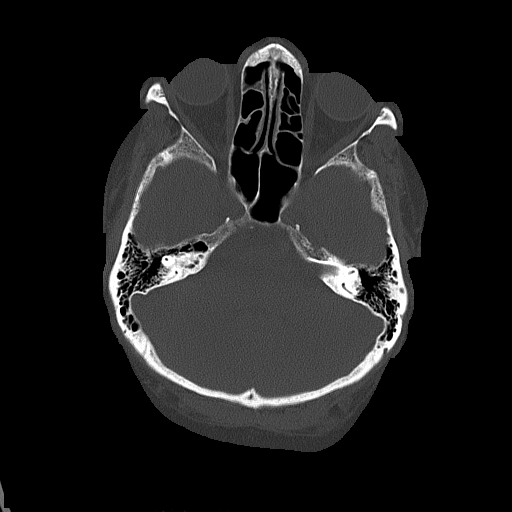
[im 23/77  bone]
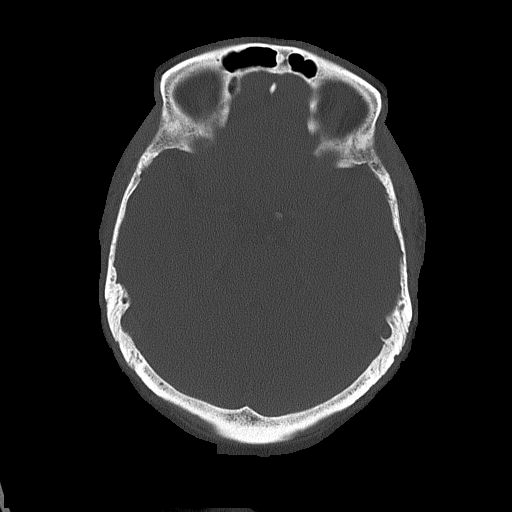

[Series 3: head wo · axial · 0.41mm/px · z∈[-130,-15]mm · 7 of 31 slices shown, 9 images]
[im 4/31  brain]
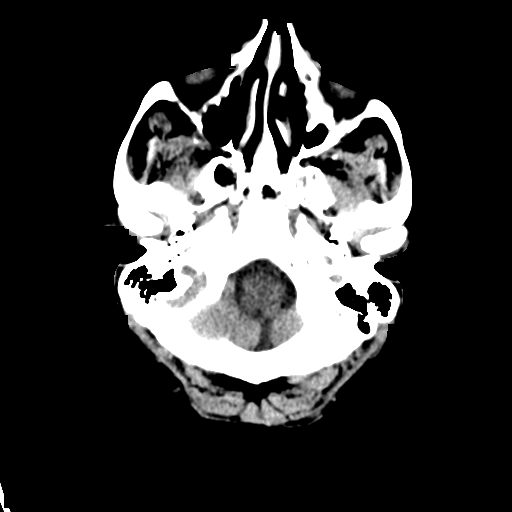
[im 4/31  bone]
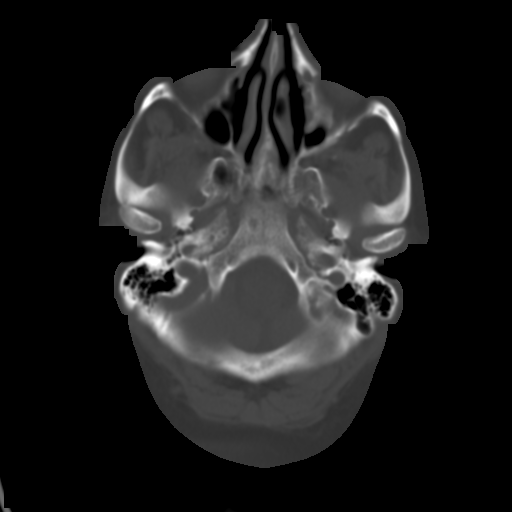
[im 8/31  brain]
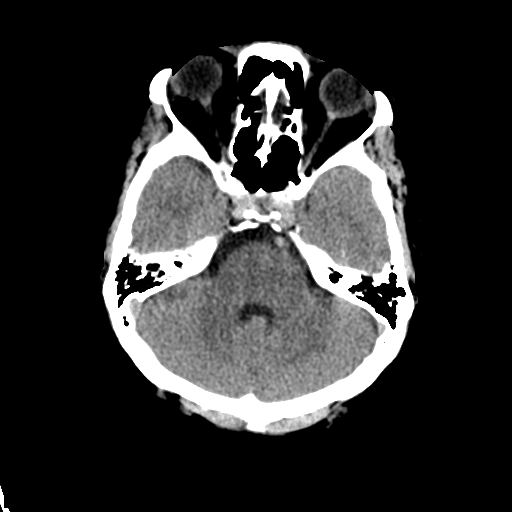
[im 12/31  brain]
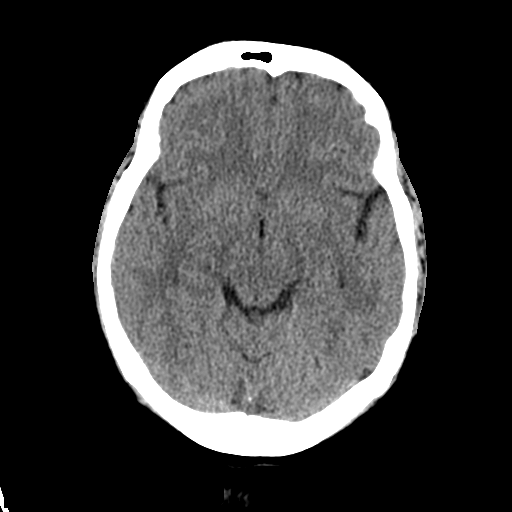
[im 16/31  brain]
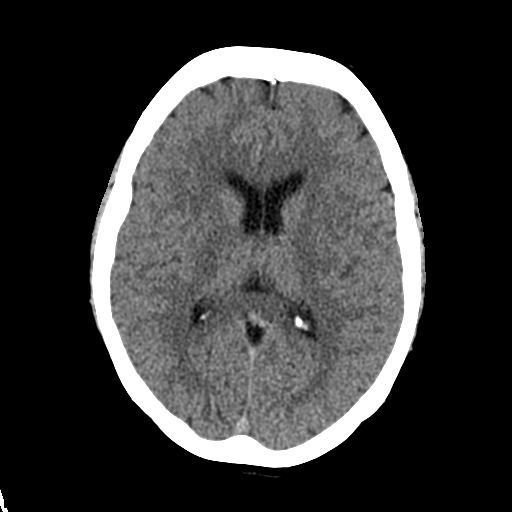
[im 19/31  brain]
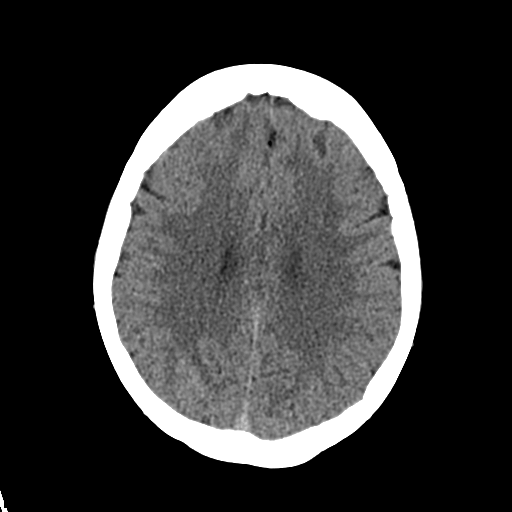
[im 19/31  bone]
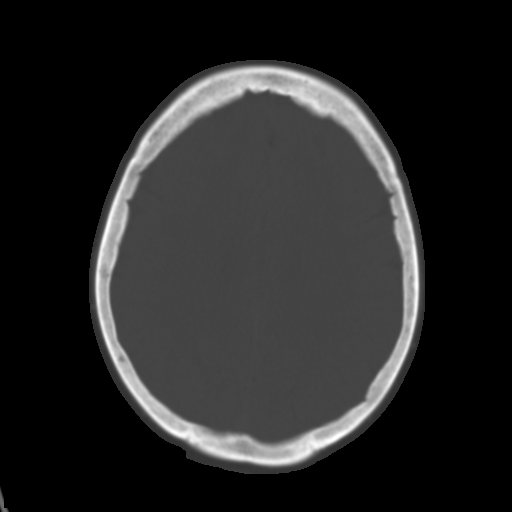
[im 23/31  brain]
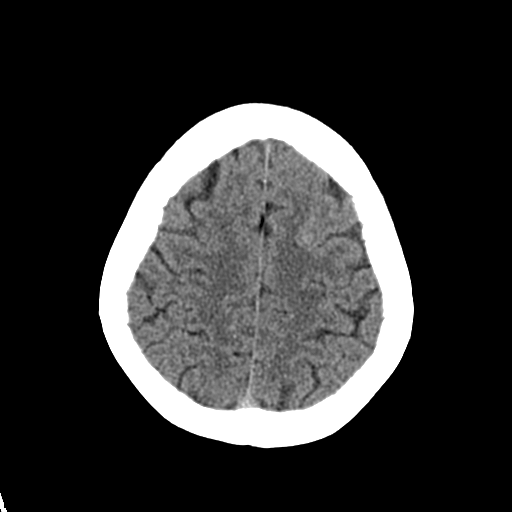
[im 27/31  brain]
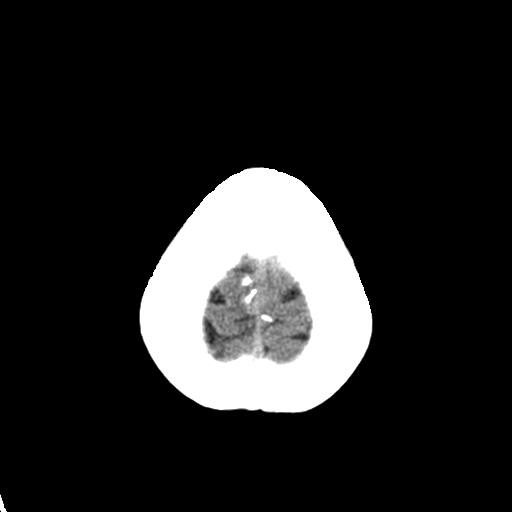

[Series 4: coronal soft tissue · coronal · 0.34mm/px · 3 of 70 slices shown]
[im 24/70  brain]
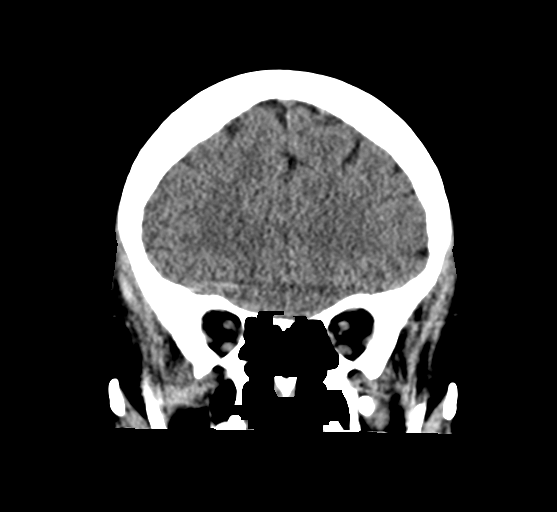
[im 31/70  brain]
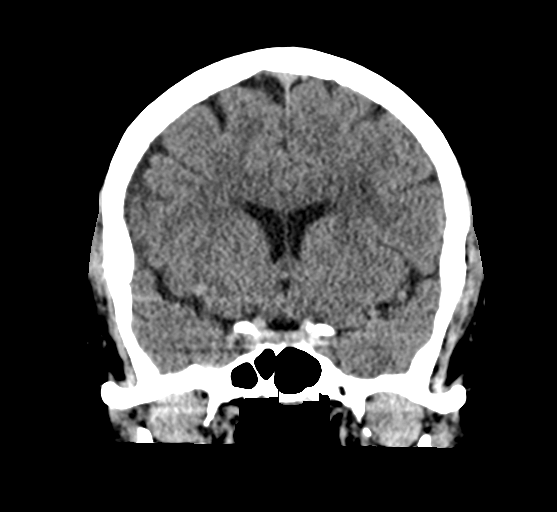
[im 39/70  brain]
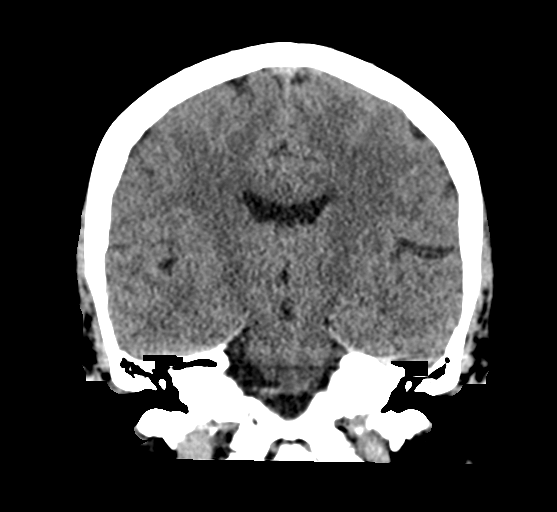

[Series 5: sagittal soft tissue · sagittal · 0.34mm/px · 3 of 53 slices shown]
[im 18/53  brain]
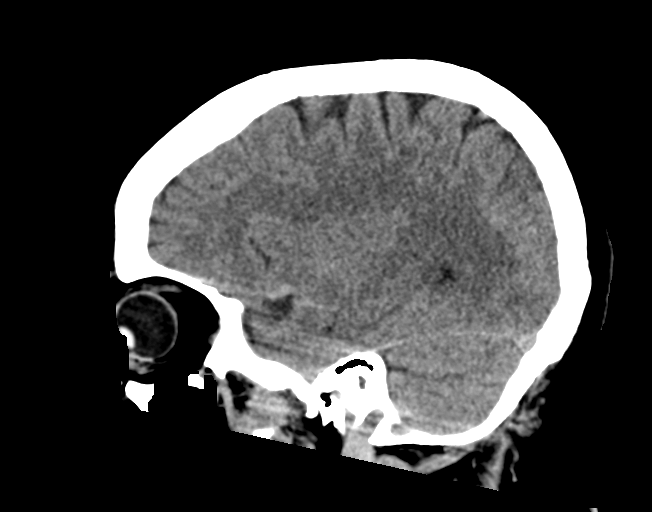
[im 27/53  brain]
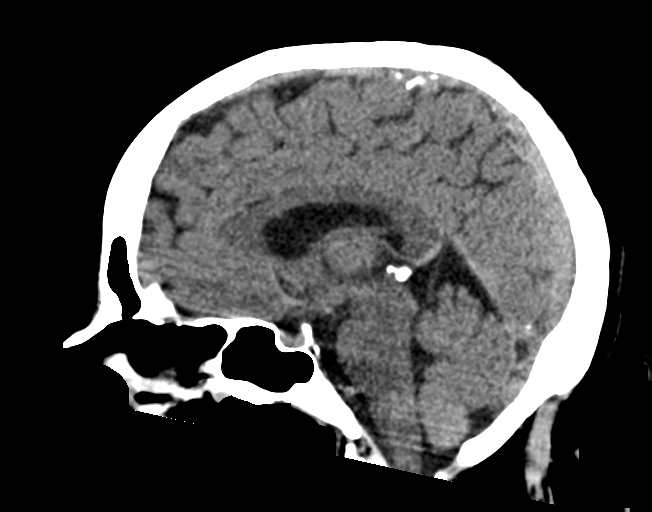
[im 35/53  brain]
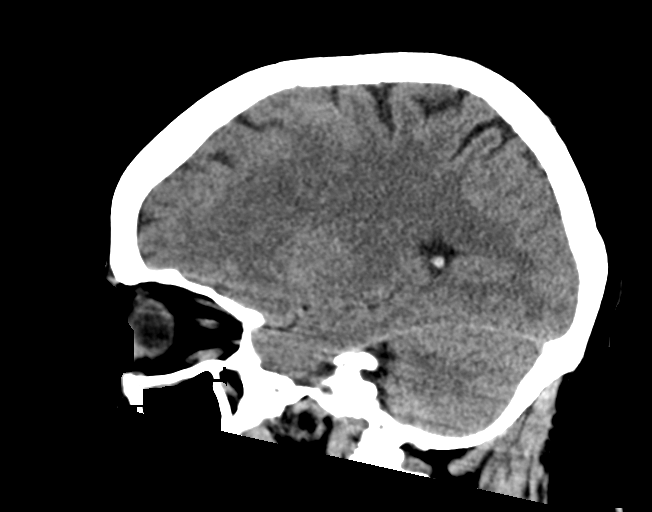

[16 of 47 positions shown; findings below may reference images not displayed]

FINDINGS: Brain: Small volume subarachnoid hemorrhage seen over the left
frontal and occipital lobes. No evidence of acute infarction,
hydrocephalus, extra-axial collection or mass lesion/mass effect.

Vascular: No hyperdense vessel or unexpected calcification.

Skull: Normal. Negative for fracture or focal lesion.

Sinuses/Orbits: Mucosal thickening of the ethmoid and left maxillary
sinus. No acute abnormality.

Other: None.
IMPRESSION: Small volume subarachnoid hemorrhage seen over the left frontal and
occipital lobes.

Critical Value/emergent results were called by telephone at the time
of interpretation on [DATE] at [DATE] to provider MUGAM
MUGAM , who verbally acknowledged these results.

## 2021-02-17 MED ORDER — ONDANSETRON HCL 4 MG/2ML IJ SOLN
4.0000 mg | Freq: Once | INTRAMUSCULAR | Status: AC
  Administered 2021-02-17: 4 mg via INTRAVENOUS
  Filled 2021-02-17: qty 2

## 2021-02-17 MED ORDER — IOHEXOL 350 MG/ML SOLN
125.0000 mL | Freq: Once | INTRAVENOUS | Status: AC | PRN
  Administered 2021-02-17: 125 mL via INTRAVENOUS

## 2021-02-17 MED ORDER — ACETAMINOPHEN 325 MG PO TABS
650.0000 mg | ORAL_TABLET | Freq: Once | ORAL | Status: AC
  Administered 2021-02-17: 650 mg via ORAL
  Filled 2021-02-17: qty 2

## 2021-02-17 MED ORDER — CARVEDILOL 25 MG PO TABS: 25.0000 mg | ORAL_TABLET | Freq: Once | ORAL | Status: DC

## 2021-02-17 MED ORDER — CLEVIDIPINE BUTYRATE 0.5 MG/ML IV EMUL
0.0000 mg/h | INTRAVENOUS | Status: DC
  Administered 2021-02-17: 1 mg/h via INTRAVENOUS
  Filled 2021-02-17: qty 50

## 2021-02-17 MED ORDER — AMLODIPINE BESYLATE 5 MG PO TABS: 5.0000 mg | ORAL_TABLET | Freq: Every day | ORAL | Status: DC

## 2021-02-17 MED ORDER — FENTANYL CITRATE PF 50 MCG/ML IJ SOSY
50.0000 ug | PREFILLED_SYRINGE | Freq: Once | INTRAMUSCULAR | Status: AC
  Administered 2021-02-17: 50 ug via INTRAVENOUS
  Filled 2021-02-17: qty 1

## 2021-02-17 NOTE — ED Notes (Signed)
Pt in bed, md at bedside attempting LP, tech at bedside assisting.  Computer interpreter in use

## 2021-02-17 NOTE — ED Provider Notes (Signed)
[facility] Department Provider Note  ____________________________________________   Event Date/Time   First MD Initiated Contact with Patient 02/17/21 1201     (approximate)  I have reviewed the triage vital signs and the nursing notes.   HISTORY  Chief Complaint Hypertension and Headache   HPI Linda Clark is a 61 y.o. female with a past medical history of HTN, HDL, GERD and CAD who presents for assessment of sudden onset of headache associate with neck pain and back pain.  This started around 10:30 AM today.  Patient denies any injuries falls or trauma.  She denies any fevers, cough, shortness of breath, abdominal pain, vision changes, vertigo or extremity weakness numbness or tingling.  No prior similar episodes.  No recent vomiting, diarrhea, burning with urination, rash or other sick symptoms.  Patient denies being on any blood thinners.  She states she is compliant with all her blood pressure medicines.  No other acute concerns at this time.  She denies EtOH use, also drug use or tobacco abuse.         Past Medical History:  Diagnosis Date   Hypertension    MI (myocardial infarction) ([place])    Sinusitis chronic, frontal    Thyroid disease     There are no problems to display for this patient.   Past Surgical History:  Procedure Laterality Date   APPENDECTOMY     BACK SURGERY      Prior to Admission medications   Medication Sig Start Date End Date Taking? Authorizing Provider  amLODipine (NORVASC) 10 MG tablet Take 10 mg by mouth daily. 10/19/20  Yes [provider]  atorvastatin (LIPITOR) 40 MG tablet Take 40 mg by mouth at bedtime. 09/15/20  Yes [provider]  carvedilol (COREG) 25 MG tablet Take 25 mg by mouth 2 (two) times daily with a meal. 11/13/20  Yes [provider]  ezetimibe (ZETIA) 10 MG tablet Take 10 mg by mouth daily. 11/13/20  Yes [provider]  aspirin  EC 81 MG tablet Take 81 mg by mouth daily. Patient not taking: Reported on 02/17/2021 09/15/20   [provider]  nitroGLYCERIN (NITROSTAT) 0.4 MG SL tablet Place 0.4 mg under the tongue as directed. 09/22/20   [provider]    Allergies Aspirin  Family History  Problem Relation Age of Onset   Breast cancer Maternal Aunt 81   Breast cancer Cousin 38    Social History Social History   Tobacco Use   Smoking status: Never   Smokeless tobacco: Never  Substance Use Topics   Alcohol use: Not Currently   Drug use: Not Currently    Review of Systems  Review of Systems  Constitutional:  Negative for chills and fever.  HENT:  Negative for sore throat.   Eyes:  Negative for pain.  Respiratory:  Negative for cough and stridor.   Cardiovascular:  Negative for chest pain.  Gastrointestinal:  Negative for vomiting.  Genitourinary:  Negative for dysuria.  Musculoskeletal:  Positive for back pain and neck pain.  Skin:  Negative for rash.  Neurological:  Positive for headaches. Negative for seizures and loss of consciousness.  Psychiatric/Behavioral:  Negative for suicidal ideas.   All other systems reviewed and are negative.    ____________________________________________   PHYSICAL EXAM:  VITAL SIGNS: ED Triage Vitals  Enc Vitals Group     BP 02/17/21 1153 (!) 209/123     Pulse Rate 02/17/21 1153 97  Resp 02/17/21 1153 18     Temp 02/17/21 1153 (!) 97 F (36.1 C)     Temp Source 02/17/21 1153 Axillary     SpO2 02/17/21 1153 95 %     Weight --      Height --      Head Circumference --      Peak Flow --      Pain Score 02/17/21 1158 10     Pain Loc --      Pain Edu? --      Excl. in [place]? --    Vitals:   02/17/21 1445 02/17/21 1500  BP: 128/83 120/79  Pulse: 75 85  Resp: (!) 22 (!) 22  Temp:    SpO2: 93% 92%   Physical Exam Vitals and nursing note reviewed.  Constitutional:      General: She is not in acute distress.    Appearance: She is  well-developed. She is ill-appearing.  HENT:     Head: Normocephalic and atraumatic.     Right Ear: External ear normal.     Left Ear: External ear normal.     Nose: Nose normal.  Eyes:     Conjunctiva/sclera: Conjunctivae normal.  Cardiovascular:     Rate and Rhythm: Normal rate and regular rhythm.     Heart sounds: No murmur heard. Pulmonary:     Effort: Pulmonary effort is normal. No respiratory distress.     Breath sounds: Normal breath sounds.  Abdominal:     Palpations: Abdomen is soft.     Tenderness: There is no abdominal tenderness.  Musculoskeletal:        General: No swelling.     Cervical back: Neck supple.  Skin:    General: Skin is warm and dry.     Capillary Refill: Capillary refill takes less than 2 seconds.  Neurological:     Mental Status: She is alert.  Psychiatric:        Mood and Affect: Mood normal.    Cranial nerves II through XII grossly intact.  No pronator drift.  No finger dysmetria.  Symmetric 5/5 strength of all extremities.  Sensation intact to light touch in all extremities.   ____________________________________________   LABS (all labs ordered are listed, but only abnormal results are displayed)  Labs Reviewed  CBC WITH DIFFERENTIAL/PLATELET - Abnormal; Notable for the following components:      Result Value   RBC 5.36 (*)    Hemoglobin 16.0 (*)    HCT 49.6 (*)    All other components within normal limits  COMPREHENSIVE METABOLIC PANEL - Abnormal; Notable for the following components:   Glucose, Bld 111 (*)    AST 54 (*)    All other components within normal limits  URINALYSIS, ROUTINE W REFLEX MICROSCOPIC - Abnormal; Notable for the following components:   Color, Urine STRAW (*)    APPearance CLEAR (*)    Specific Gravity, Urine 1.034 (*)    All other components within normal limits  RESP PANEL BY RT-PCR (FLU A&B, COVID) ARPGX2  CSF CULTURE W GRAM STAIN  ETHANOL  PROTIME-INR  APTT  URINE DRUG SCREEN, QUALITATIVE ([facility] ONLY)   CSF CELL COUNT WITH DIFFERENTIAL  PROTEIN AND GLUCOSE, CSF  TROPONIN I (HIGH SENSITIVITY)   ____________________________________________  EKG  ECG shows sinus rhythm with a ventricular rate of 83, left anterior fascicle block with nonspecific change in lead III without other clear evidence of acute ischemia or significant arrhythmia. ____________________________________________  RADIOLOGY  ED MD  interpretation: CT head without contrast shows no large ischemia, ventriculomegaly or mass-effect.  I am unable to visualize subarachnoid hemorrhage seen by radiology.  CTA head and neck shows no evidence of an aneurysm, stenosis, occlusion, dissection or hemorrhage.  CTA chest abdomen pelvis shows no evidence of dissection or aneurysm.  There is some gallbladder sludge and aortic atherosclerosis but no other acute abdominal pelvic process.  Official radiology report(s): CT ANGIO HEAD NECK W WO CM  Result Date: 02/17/2021 CLINICAL DATA:  Same-day noncontrast CT head EXAM: CT ANGIOGRAPHY HEAD AND NECK TECHNIQUE: Multidetector CT imaging of the head and neck was performed using the standard protocol during bolus administration of intravenous contrast. Multiplanar CT image reconstructions and MIPs were obtained to evaluate the vascular anatomy. Carotid stenosis measurements (when applicable) are obtained utilizing NASCET criteria, using the distal internal carotid diameter as the denominator. CONTRAST:  OMNIPAQUE IOHEXOL 350 MG/ML SOLN COMPARISON:  Same-day noncontrast CT head. FINDINGS: CTA NECK FINDINGS Aortic arch: The left vertebral artery arises directly from the aortic arch, a normal variant. Imaged portion shows no evidence of aneurysm or dissection. No significant stenosis of the major arch vessel origins. Right carotid system: The right common, internal, and external carotid arteries are patent, without hemodynamically significant stenosis, occlusion, dissection, or aneurysm. The right  internal carotid artery has a medialized retropharyngeal course. Left carotid system: The left common, internal, and external carotid arteries are patent, without hemodynamically significant stenosis, occlusion, dissection, or aneurysm. Vertebral arteries: The right vertebral artery is dominant with a diminutive left vertebral artery., a normal variant. The vertebral arteries are patent, without hemodynamically significant stenosis, occlusion, dissection, or aneurysm. Skeleton: There is mild degenerative change at C5-C6. There is no visible canal hematoma. There is no acute osseous abnormality or aggressive osseous lesion. Other neck: The soft tissues are unremarkable. Upper chest: The imaged lung apices are clear. Review of the MIP images confirms the above findings CTA HEAD FINDINGS Anterior circulation: The bilateral cavernous ICAs are patent with minimal plaque in the left supraclinoid segment. The bilateral MCAs are patent. The bilateral ACAs are patent. There is no aneurysm. Posterior circulation: The left V4 segment is markedly diminutive after the PICA origin, likely developmental variant. The prominent right V4 segment is patent. The basilar artery is patent. The bilateral PCAs are patent. There is no aneurysm. Venous sinuses: Patent. Anatomic variants: None. Review of the MIP images confirms the above findings IMPRESSION: 1. No aneurysm identified. 2. Patent vasculature of the head and neck, with no hemodynamically significant stenosis, occlusion, or dissection. Electronically Signed   By: Lesia Hausen M.D.   On: 02/17/2021 14:02   CT Head Wo Contrast  Result Date: 02/17/2021 CLINICAL DATA:  Headache EXAM: CT HEAD WITHOUT CONTRAST TECHNIQUE: Contiguous axial images were obtained from the base of the skull through the vertex without intravenous contrast. COMPARISON:  None. FINDINGS: Brain: Small volume subarachnoid hemorrhage seen over the left frontal and occipital lobes. No evidence of acute  infarction, hydrocephalus, extra-axial collection or mass lesion/mass effect. Vascular: No hyperdense vessel or unexpected calcification. Skull: Normal. Negative for fracture or focal lesion. Sinuses/Orbits: Mucosal thickening of the ethmoid and left maxillary sinus. No acute abnormality. Other: None. IMPRESSION: Small volume subarachnoid hemorrhage seen over the left frontal and occipital lobes. Critical Value/emergent results were called by telephone at the time of interpretation on 02/17/2021 at 12:50 pm to provider JENISE MENSHEW , who verbally acknowledged these results. Electronically Signed   By: Allegra Lai M.D.   On: 02/17/2021  12:50   CT Angio Chest/Abd/Pel for Dissection W and/or Wo Contrast  Result Date: 02/17/2021 CLINICAL DATA:  Hypertension. Small subarachnoid hemorrhage on head CT. EXAM: CT ANGIOGRAPHY CHEST, ABDOMEN AND PELVIS TECHNIQUE: Non-contrast CT of the chest was initially obtained. Multidetector CT imaging through the chest, abdomen and pelvis was performed using the standard protocol during bolus administration of intravenous contrast. Multiplanar reconstructed images and MIPs were obtained and reviewed to evaluate the vascular anatomy. CONTRAST:  154mL OMNIPAQUE IOHEXOL 350 MG/ML SOLN COMPARISON:  None. FINDINGS: CTA CHEST FINDINGS Cardiovascular: Preferential opacification of the thoracic aorta. No evidence of thoracic aortic aneurysm or dissection. Normal heart size. No pericardial effusion. No central pulmonary embolism. Mediastinum/Nodes: No enlarged mediastinal, hilar, or axillary lymph nodes. Thyroid gland, trachea, and esophagus demonstrate no significant findings. Lungs/Pleura: Lungs are clear. No pleural effusion or pneumothorax. Musculoskeletal: No chest wall abnormality. No acute or significant osseous findings. Review of the MIP images confirms the above findings. CTA ABDOMEN AND PELVIS FINDINGS VASCULAR Aorta: Normal caliber aorta without aneurysm, dissection,  vasculitis or significant stenosis. Mild atherosclerotic calcification. Celiac: Patent without evidence of aneurysm, dissection, vasculitis or significant stenosis. SMA: Patent without evidence of aneurysm, dissection, vasculitis or significant stenosis. Renals: Both renal arteries are patent without evidence of aneurysm, dissection, vasculitis, fibromuscular dysplasia or significant stenosis. IMA: Patent without evidence of aneurysm, dissection, vasculitis or significant stenosis. Inflow: Patent without evidence of aneurysm, dissection, vasculitis or significant stenosis. Veins: No obvious venous abnormality within the limitations of this arterial phase study. Review of the MIP images confirms the above findings. NON-VASCULAR Hepatobiliary: Scattered hepatic simple cysts measuring up to 2.6 cm. Other subcentimeter low-density lesions within the liver are too small to characterize. Gallbladder sludge. No gallbladder wall thickening or biliary dilatation. Pancreas: Unremarkable. No pancreatic ductal dilatation or surrounding inflammatory changes. Spleen: Normal in size without focal abnormality. Adrenals/Urinary Tract: Adrenal glands are unremarkable. Bilateral renal simple cysts measuring up to 2.2 cm. No renal calculi or hydronephrosis. The bladder is unremarkable. Stomach/Bowel: Small hiatal hernia. The stomach is otherwise within normal limits. No bowel wall thickening, distention, or surrounding inflammatory changes. Lymphatic: No enlarged abdominal or pelvic lymph nodes. Reproductive: Uterus and bilateral adnexa are unremarkable. Other: No abdominal wall hernia or abnormality. No abdominopelvic ascites. No pneumoperitoneum. Musculoskeletal: No acute or significant osseous findings. Review of the MIP images confirms the above findings. IMPRESSION: 1. No evidence of acute aortic syndrome. No aneurysm. 2. Gallbladder sludge. 3.  Aortic atherosclerosis (ICD10-I70.0). Electronically Signed   By: Titus Dubin M.D.    On: 02/17/2021 14:03    ____________________________________________   PROCEDURES  Procedure(s) performed (including Critical Care):  .Lumbar Puncture  Date/Time: 02/17/2021 3:30 PM Performed by: Lucrezia Starch, MD Authorized by: Lucrezia Starch, MD   Consent:    Consent obtained:  Verbal   Consent given by:  Patient   Risks, benefits, and alternatives were discussed: yes     Risks discussed:  Bleeding, headache, nerve damage, infection, pain and repeat procedure   Alternatives discussed:  No treatment Universal protocol:    Procedure explained and questions answered to patient or proxy's satisfaction: yes     Patient identity confirmed:  Verbally with patient Pre-procedure details:    Procedure purpose:  Diagnostic   Preparation: Patient was prepped and draped in usual sterile fashion   Anesthesia:    Anesthesia method:  Local infiltration   Local anesthetic:  Lidocaine 1% w/o epi Procedure details:    Lumbar space:  L3-L4 interspace   Number  of attempts:  2   Fluid appearance:  Clear   Tubes of fluid:  4 Post-procedure details:    Puncture site:  Adhesive bandage applied   Procedure completion:  Tolerated well, no immediate complications .1-3 Lead EKG Interpretation Performed by: Lucrezia Starch, MD Authorized by: Lucrezia Starch, MD     Interpretation: normal     ECG rate assessment: normal     Rhythm: sinus rhythm     Ectopy: none     Conduction: normal     ____________________________________________   INITIAL IMPRESSION / ASSESSMENT AND PLAN / ED COURSE      Patient presents with above-stated history exam for assessment of sudden onset of headache associate with some neck pain and back pain that occurred earlier today.  No associated trauma.  On arrival patient is hypertensive with a BP of 209/123 with otherwise stable vital signs on room air.  She does have a little neck stiffness but otherwise is nonfocal supine neuro exam is awake and alert.   Lungs are clear bilaterally and abdomen is soft.  Differential includes SAH, hypertensive emergency, with a lower suspicion at this time for trauma or acute infectious process.  Given she reports the pain is rating down her neck and back differential considerations also include dissection versus atypical presentation for ACS.  CMP shows no significant electrolyte or metabolic derangements.  CBC shows no acute cytosis or significant anemia.  COVID influenza PCR is negative.  This also detectable.  INR and PTT are unremarkable.  ECG and nonelevated troponin not suggestive of ACS.  UDS and UA is unremarkable.  CT head without contrast shows no large ischemia, ventriculomegaly or mass-effect.  I am unable to visualize subarachnoid hemorrhage seen by radiology.  CTA head and neck shows no evidence of an aneurysm, stenosis, occlusion, dissection or hemorrhage.  CTA chest abdomen pelvis shows no evidence of dissection or aneurysm.  There is some gallbladder sludge and aortic atherosclerosis but no other acute abdominal pelvic process.   After initial Noncon CT that was read by radiology with concern for [facility] immediately consulted neurosurgery.  I discussed with Dr. Lacinda Axon who recommended starting patient on antihypertensive drip.  Cleviprex drip started.  This was immediately followed up by CTA imaging above which did not show any aneurysm or hemorrhage.  While certainly possible initial read was reflecting some artifact Dr. Lacinda Axon did recommend obtaining LP process for xanthochromia to fully rule out an [facility].  LP performed per procedure note above.  Care patient signed over to assuming provider approximately 1500.  Plan is to follow-up CSF results and reassess patient.  Also follow-up with neurosurgery and if all unremarkable patient may be able to be discharged home with possible diagnosis of hypertensive urgency.   ____________________________________________   FINAL CLINICAL IMPRESSION(S) / ED  DIAGNOSES  Final diagnoses:  Hypertensive urgency    Medications  clevidipine (CLEVIPREX) infusion 0.5 mg/mL (0 mg/hr Intravenous Stopped 02/17/21 1418)  acetaminophen (TYLENOL) tablet 650 mg (650 mg Oral Given 02/17/21 1205)  fentaNYL (SUBLIMAZE) injection 50 mcg (50 mcg Intravenous Given 02/17/21 1416)  iohexol (OMNIPAQUE) 350 MG/ML injection 125 mL (125 mLs Intravenous Contrast Given 02/17/21 1348)  ondansetron (ZOFRAN) injection 4 mg (4 mg Intravenous Given 02/17/21 1507)     ED Discharge Orders     None        Note:  This document was prepared using Dragon voice recognition software and may include unintentional dictation errors.    Lucrezia Starch, MD 02/17/21  1536  

## 2021-02-17 NOTE — ED Notes (Signed)
Computer interpreter used, pt states that she is ready to go home, reports some sounder blade pain and 5/10 headache, states that she is ready to go home.  Verbalized understanding d/c and follow up, ambulatory from dpt.

## 2021-02-17 NOTE — ED Provider Notes (Signed)
CSF cell count.  Gram stain without any concerning findings.  Patient continues to feel better.  I did have a discussion with the patient using the interpreter.  Given that she feels better and CSF without any concerning results do think is reasonable for patient to be discharged.  I discussed with patient importance of close primary care follow-up as well as return precautions.   Phineas Semen, MD 02/17/21 [phone]

## 2021-02-17 NOTE — ED Triage Notes (Signed)
Pt c/o sudden onset HA that started around an hour ago, b/p is elevated on arrival. Pt is a/ox4 on arrival with no deficits.

## 2021-02-17 NOTE — ED Notes (Signed)
Pt stopped drinking during swallow screen, states that it feels like it won't go down.

## 2021-02-17 NOTE — ED Provider Notes (Signed)
Emergency Medicine Provider Triage Evaluation Note  Linda Clark [address] [place], a 60 y.o. female  was evaluated in triage.  History limited by [place] language.  Interpreter used for interview and triage pt complains of headache and elevated blood pressure.  She gives history of a heart attack several months earlier, but notes that she takes her daily meds without fail.  She noted onset of a headache about an hour ago after she reported to work patient denies any associated nausea, vomiting, or dizziness.  She also denies any shortness of breath, fever, chills, or sweats.  Review of Systems  Positive: Headache, elevated BP Negative: Chest pain, shortness of breath  Physical Exam  BP (!) 209/123 (BP Location: Right Arm)   Pulse 97   Temp (!) 97 F (36.1 C) (Axillary)   Resp 18   SpO2 95%  Gen:   Awake, no distress   Resp:  Normal effort CTA MSK:   Moves extremities without difficulty  Other:  CVS: RRR  Medical Decision Making  Medically screening exam initiated at 12:02 PM.  Appropriate orders placed.  Linda Clark was informed that the remainder of the evaluation will be completed by another provider, this initial triage assessment does not replace that evaluation, and the importance of remaining in the ED until their evaluation is complete.  Patient with history of hypertension, presents to the ED with elevated blood pressure and sudden headache.   Lissa Hoard, PA-C 02/17/21 1203    Chesley Noon, MD 02/17/21 1454

## 2021-02-17 NOTE — ED Notes (Signed)
Md at bedside, bp is better, d/c cleviprex gtt, informed of nih and swallow eval, re attempt, pt passed, states that she is feeling better.

## 2021-02-17 NOTE — Discharge Instructions (Signed)
As we discussed please follow up closely with your provider. Please seek medical attention for any high fevers, chest pain, shortness of breath, change in behavior, persistent vomiting, bloody stool or any other new or concerning symptoms.

## 2021-02-18 ENCOUNTER — Emergency Department: Payer: Self-pay

## 2021-02-18 ENCOUNTER — Other Ambulatory Visit: Payer: Self-pay

## 2021-02-18 ENCOUNTER — Inpatient Hospital Stay
Admission: EM | Admit: 2021-02-18 | Discharge: 2021-02-25 | DRG: 417 | Disposition: A | Discharge status: Home / Self Care | Payer: Self-pay | Attending: Internal Medicine | Admitting: Internal Medicine

## 2021-02-18 ENCOUNTER — Encounter: Payer: Self-pay | Admitting: Emergency Medicine

## 2021-02-18 DIAGNOSIS — R9389 Abnormal findings on diagnostic imaging of other specified body structures: Secondary | ICD-10-CM

## 2021-02-18 DIAGNOSIS — E860 Dehydration: Secondary | ICD-10-CM

## 2021-02-18 DIAGNOSIS — K851 Biliary acute pancreatitis without necrosis or infection: Secondary | ICD-10-CM | POA: Diagnosis present

## 2021-02-18 DIAGNOSIS — I252 Old myocardial infarction: Secondary | ICD-10-CM

## 2021-02-18 DIAGNOSIS — R112 Nausea with vomiting, unspecified: Secondary | ICD-10-CM

## 2021-02-18 DIAGNOSIS — R7989 Other specified abnormal findings of blood chemistry: Secondary | ICD-10-CM | POA: Diagnosis present

## 2021-02-18 DIAGNOSIS — Z803 Family history of malignant neoplasm of breast: Secondary | ICD-10-CM

## 2021-02-18 DIAGNOSIS — R748 Abnormal levels of other serum enzymes: Secondary | ICD-10-CM

## 2021-02-18 DIAGNOSIS — K8500 Idiopathic acute pancreatitis without necrosis or infection: Secondary | ICD-10-CM

## 2021-02-18 DIAGNOSIS — E782 Mixed hyperlipidemia: Secondary | ICD-10-CM

## 2021-02-18 DIAGNOSIS — K5900 Constipation, unspecified: Secondary | ICD-10-CM

## 2021-02-18 DIAGNOSIS — R1013 Epigastric pain: Secondary | ICD-10-CM

## 2021-02-18 DIAGNOSIS — I160 Hypertensive urgency: Secondary | ICD-10-CM

## 2021-02-18 DIAGNOSIS — R739 Hyperglycemia, unspecified: Secondary | ICD-10-CM

## 2021-02-18 DIAGNOSIS — K219 Gastro-esophageal reflux disease without esophagitis: Secondary | ICD-10-CM | POA: Diagnosis present

## 2021-02-18 DIAGNOSIS — R7401 Elevation of levels of liver transaminase levels: Secondary | ICD-10-CM

## 2021-02-18 DIAGNOSIS — I2510 Atherosclerotic heart disease of native coronary artery without angina pectoris: Secondary | ICD-10-CM

## 2021-02-18 DIAGNOSIS — Z79899 Other long term (current) drug therapy: Secondary | ICD-10-CM

## 2021-02-18 DIAGNOSIS — E876 Hypokalemia: Secondary | ICD-10-CM | POA: Diagnosis not present

## 2021-02-18 DIAGNOSIS — R109 Unspecified abdominal pain: Secondary | ICD-10-CM

## 2021-02-18 DIAGNOSIS — I10 Essential (primary) hypertension: Secondary | ICD-10-CM

## 2021-02-18 DIAGNOSIS — Z6831 Body mass index (BMI) 31.0-31.9, adult: Secondary | ICD-10-CM

## 2021-02-18 DIAGNOSIS — K828 Other specified diseases of gallbladder: Secondary | ICD-10-CM | POA: Diagnosis present

## 2021-02-18 DIAGNOSIS — K859 Acute pancreatitis without necrosis or infection, unspecified: Secondary | ICD-10-CM

## 2021-02-18 DIAGNOSIS — E66811 Obesity, class 1: Secondary | ICD-10-CM

## 2021-02-18 DIAGNOSIS — K760 Fatty (change of) liver, not elsewhere classified: Secondary | ICD-10-CM

## 2021-02-18 DIAGNOSIS — K838 Other specified diseases of biliary tract: Secondary | ICD-10-CM

## 2021-02-18 DIAGNOSIS — R1011 Right upper quadrant pain: Secondary | ICD-10-CM

## 2021-02-18 DIAGNOSIS — E669 Obesity, unspecified: Secondary | ICD-10-CM | POA: Diagnosis present

## 2021-02-18 DIAGNOSIS — Z20822 Contact with and (suspected) exposure to covid-19: Secondary | ICD-10-CM | POA: Diagnosis present

## 2021-02-18 DIAGNOSIS — K8000 Calculus of gallbladder with acute cholecystitis without obstruction: Principal | ICD-10-CM | POA: Diagnosis present

## 2021-02-18 LAB — URINALYSIS, ROUTINE W REFLEX MICROSCOPIC
Glucose, UA: NEGATIVE mg/dL
Ketones, ur: NEGATIVE mg/dL
Leukocytes,Ua: NEGATIVE
Nitrite: NEGATIVE
Protein, ur: NEGATIVE mg/dL
Specific Gravity, Urine: 1.015 (ref 1.005–1.030)
pH: 5 (ref 5.0–8.0)

## 2021-02-18 LAB — COMPREHENSIVE METABOLIC PANEL
ALT: 296 U/L — ABNORMAL HIGH (ref 0–44)
AST: 367 U/L — ABNORMAL HIGH (ref 15–41)
Albumin: 4.6 g/dL (ref 3.5–5.0)
Alkaline Phosphatase: 85 U/L (ref 38–126)
Anion gap: 8 (ref 5–15)
BUN: 15 mg/dL (ref 6–20)
CO2: 28 mmol/L (ref 22–32)
Calcium: 9.3 mg/dL (ref 8.9–10.3)
Chloride: 102 mmol/L (ref 98–111)
Creatinine, Ser: 0.65 mg/dL (ref 0.44–1.00)
GFR, Estimated: >60 mL/min (ref >60)
Glucose, Bld: 156 mg/dL — ABNORMAL HIGH (ref 70–99)
Potassium: 4.3 mmol/L (ref 3.5–5.1)
Sodium: 138 mmol/L (ref 135–145)
Total Bilirubin: 3.4 mg/dL — ABNORMAL HIGH (ref 0.3–1.2)
Total Protein: 8.3 g/dL — ABNORMAL HIGH (ref 6.5–8.1)

## 2021-02-18 LAB — CBC
HCT: 50.5 % — ABNORMAL HIGH (ref 36.0–46.0)
Hemoglobin: 16.3 g/dL — ABNORMAL HIGH (ref 12.0–15.0)
MCH: 30.2 pg (ref 26.0–34.0)
MCHC: 32.3 g/dL (ref 30.0–36.0)
MCV: 93.5 fL (ref 80.0–100.0)
Platelets: 341 10*3/uL (ref 150–400)
RBC: 5.4 MIL/uL — ABNORMAL HIGH (ref 3.87–5.11)
RDW: 13 % (ref 11.5–15.5)
WBC: 9.6 10*3/uL (ref 4.0–10.5)
nRBC: 0 % (ref 0.0–0.2)

## 2021-02-18 LAB — RESP PANEL BY RT-PCR (FLU A&B, COVID) ARPGX2
Influenza A by PCR: NEGATIVE
Influenza B by PCR: NEGATIVE
SARS Coronavirus 2 by RT PCR: NEGATIVE

## 2021-02-18 LAB — LIPASE, BLOOD: Lipase: 3473 U/L — ABNORMAL HIGH (ref 11–51)

## 2021-02-18 LAB — TROPONIN I (HIGH SENSITIVITY): Troponin I (High Sensitivity): 7 ng/L (ref <18)

## 2021-02-18 IMAGING — MR MR ABDOMEN WO/W CM MRCP
19 of 21 series · 44 of 48 positions shown · IV contrast (gadavist)
Comparison: CT abdomen and pelvis [DATE], ultrasound abdomen
[DATE]

CLINICAL DATA: Right upper quadrant pain

EXAM:
MRI ABDOMEN WITHOUT AND WITH CONTRAST (INCLUDING MRCP)
TECHNIQUE: Multiplanar multisequence MR imaging of the abdomen was performed
both before and after the administration of intravenous contrast.
Heavily T2-weighted images of the biliary and pancreatic ducts were
obtained, and three-dimensional MRCP images were rendered by post
processing.
CONTRAST:  7.5mL GADAVIST GADOBUTROL 1 MMOL/ML IV SOLN

[Series 3: T2 · coronal · 6.0mm · 1.19mm/px · 1 of 35 slices shown (1 of 2)]
[im 1/35]
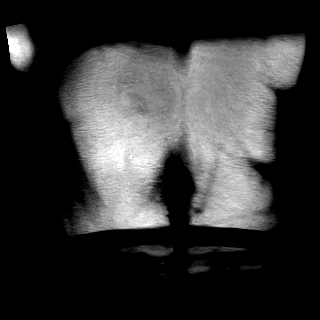

[Series 4: T2 · axial · 6.0mm · 1.19mm/px · 1 of 40 slices shown (2 of 2)]
[im 1/40]
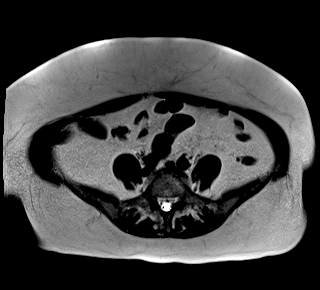

[Series 5: T2 fat-sat · axial · 6.0mm · 1.19mm/px · 1 of 40 slices shown]
[im 1/40]
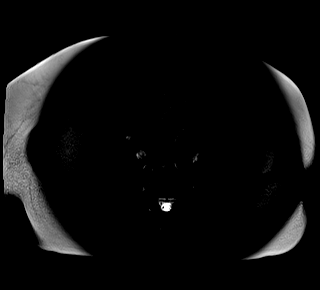

[Series 6: ax dwi_tracew · axial · 6.0mm · 1.42mm/px · z∈[-45,+251]mm · 3 of 126 slices shown]
[im 1/126]
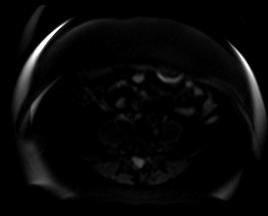
[im 63/126]
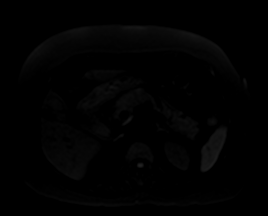
[im 126/126]
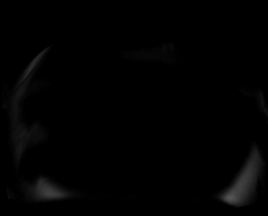

[Series 7: ax dwi_adc · axial · 6.0mm · 1.42mm/px · 1 of 42 slices shown]
[im 1/42]
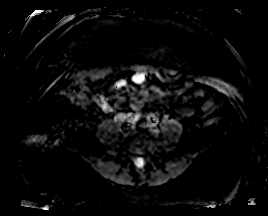

[Series 8: in & out · axial · 3.0mm · 1.19mm/px · z∈[-60,+225]mm · 3 of 96 slices shown (1 of 2)]
[im 1/96]
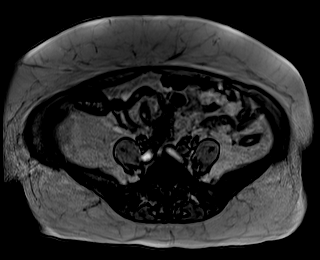
[im 48/96]
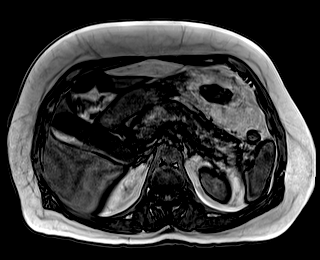
[im 96/96]
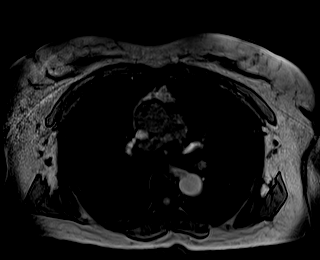

[Series 9: in & out · axial · 3.0mm · 1.19mm/px · z∈[-60,+225]mm · 3 of 96 slices shown (2 of 2)]
[im 1/96]
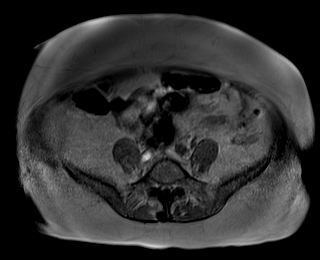
[im 48/96]
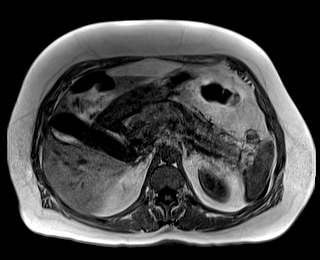
[im 96/96]
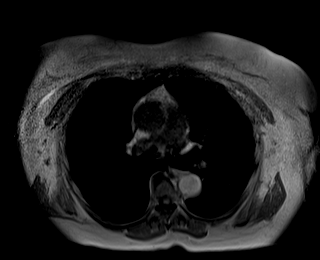

[Series 10: MRCP · coronal · 3.0mm · 1.12mm/px · 1 of 25 slices shown]
[im 1/25]
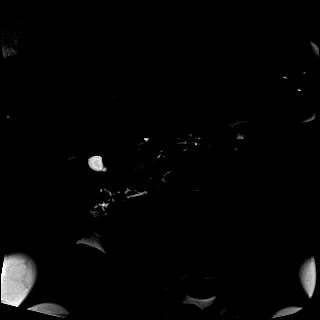

[Series 16: radials · coronal · 50.0mm · 0.78mm/px · 1 of 5 slices shown]
[im 1/5]
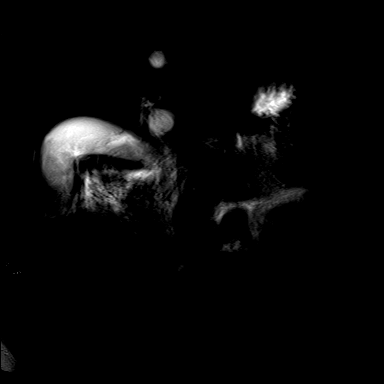

[Series 18: T1 dynamic fat-sat · axial · 3.0mm · 1.19mm/px · z∈[-60,+225]mm · 3 of 96 slices shown (1 of 9)]
[im 1/96]
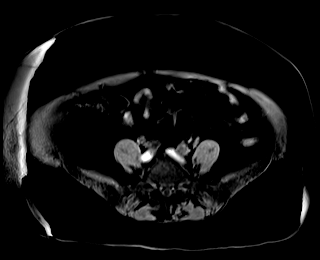
[im 48/96]
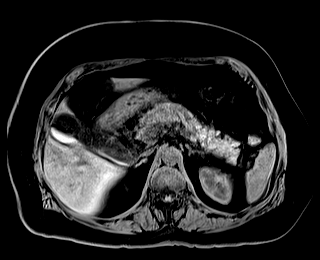
[im 96/96]
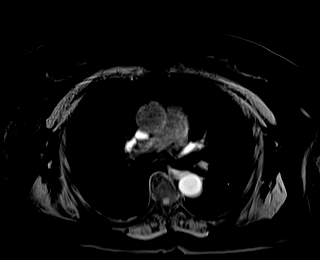

[Series 21: T1 dynamic fat-sat · axial · 3.0mm · 1.19mm/px · z∈[-60,+225]mm · 3 of 96 slices shown (2 of 9)]
[im 1/96]
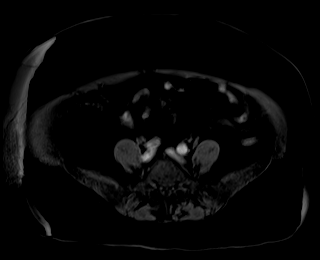
[im 48/96]
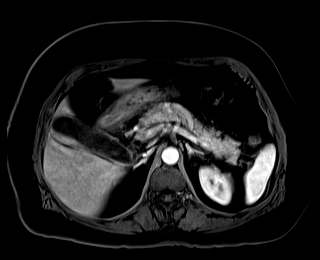
[im 96/96]
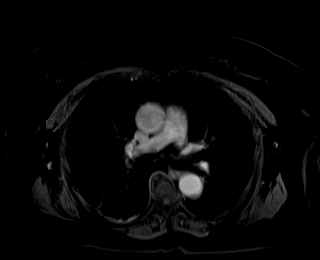

[Series 22: T1 dynamic fat-sat · axial · 3.0mm · 1.19mm/px · z∈[-60,+225]mm · 3 of 96 slices shown (3 of 9)]
[im 1/96]
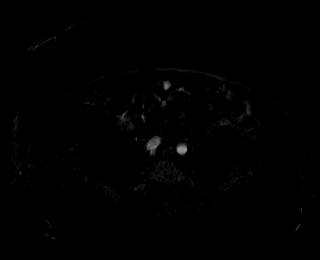
[im 48/96]
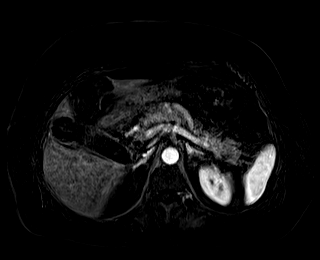
[im 96/96]
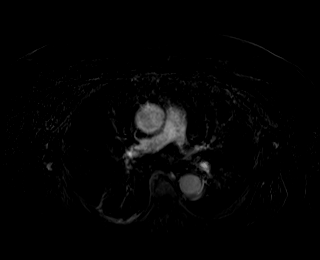

[Series 25: T1 dynamic fat-sat · axial · 3.0mm · 1.19mm/px · z∈[-60,+225]mm · 3 of 96 slices shown (4 of 9)]
[im 1/96]
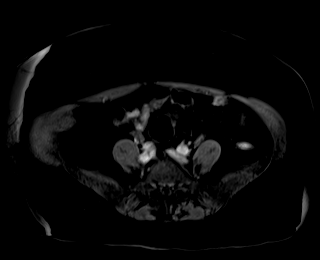
[im 48/96]
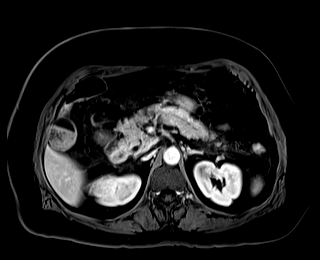
[im 96/96]
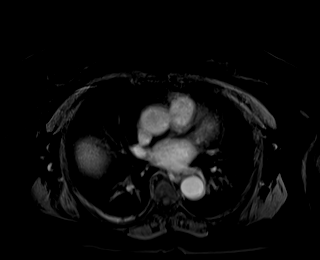

[Series 26: T1 dynamic fat-sat · axial · 3.0mm · 1.19mm/px · z∈[-60,+225]mm · 3 of 96 slices shown (5 of 9)]
[im 1/96]
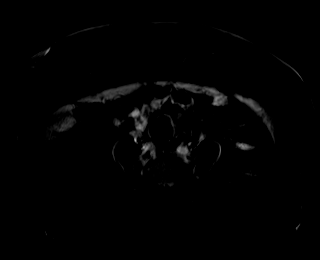
[im 48/96]
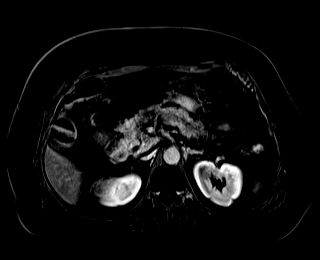
[im 96/96]
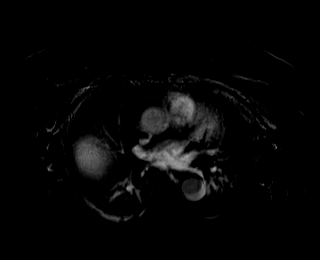

[Series 29: T1 dynamic fat-sat · axial · 3.0mm · 1.19mm/px · z∈[-60,+225]mm · 3 of 96 slices shown (6 of 9)]
[im 1/96]
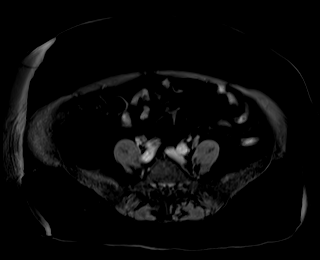
[im 48/96]
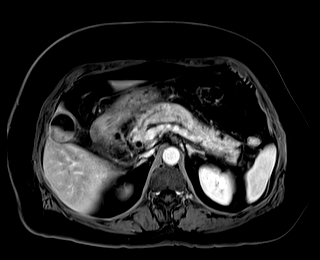
[im 96/96]
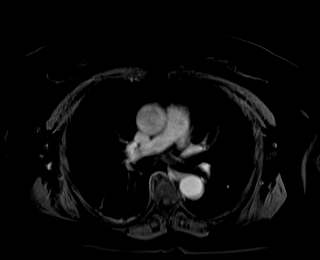

[Series 30: T1 dynamic fat-sat · axial · 3.0mm · 1.19mm/px · z∈[-60,+225]mm · 3 of 96 slices shown (7 of 9)]
[im 1/96]
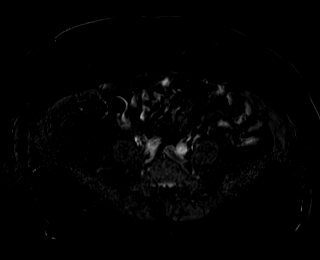
[im 48/96]
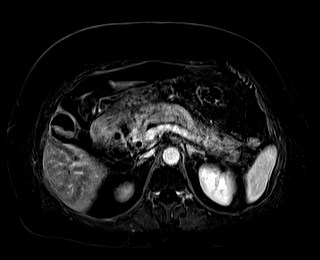
[im 96/96]
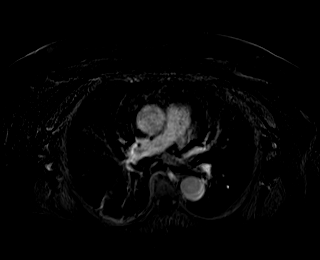

[Series 31: T1 dynamic post-contrast · coronal · 3.0mm · 1.31mm/px · 2 of 72 slices shown]
[im 1/72]
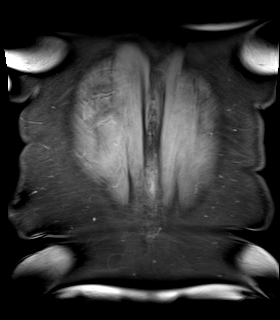
[im 72/72]
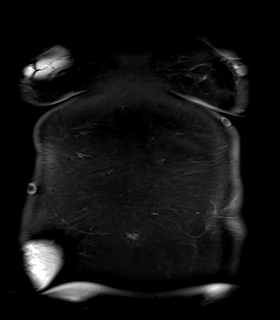

[Series 34: T1 dynamic fat-sat · axial · 3.0mm · 1.19mm/px · z∈[-60,+225]mm · 3 of 96 slices shown (8 of 9)]
[im 1/96]
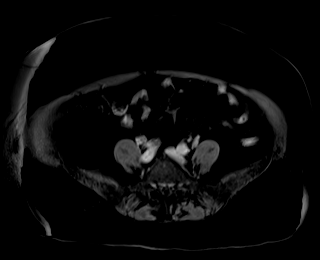
[im 48/96]
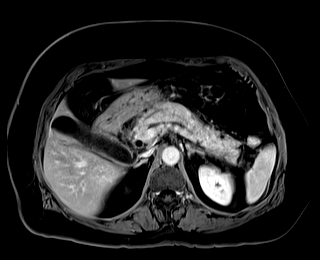
[im 96/96]
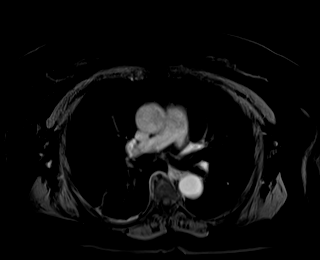

[Series 35: T1 dynamic fat-sat · axial · 3.0mm · 1.19mm/px · z∈[-60,+225]mm · 3 of 96 slices shown (9 of 9)]
[im 1/96]
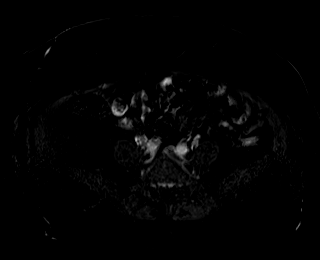
[im 48/96]
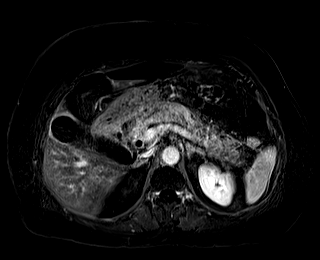
[im 96/96]
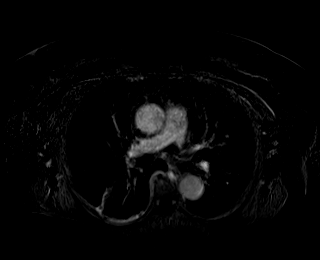

[44 of 48 positions shown; findings below may reference images not displayed]

FINDINGS: Study is significantly limited due to motion.

Lower chest: No acute findings.

Hepatobiliary: Liver is upper normal size measuring 18 cm in length.
There is evidence of hepatic steatosis. Several hyperintense T2
signal hepatic cysts are identified measuring up to 2.6 cm in size.
Layering hypointense material in the gallbladder likely representing
sludge and possible small stones. No significant gallbladder wall
thickening identified. No choledocholithiasis identified. Common
bile duct is mildly dilated measuring 7 mm in diameter. No
significant intrahepatic ductal dilatation visualized.

Pancreas: Diffuse pancreatic edema and extensive peripancreatic
edema consistent with acute pancreatitis. No pancreatic mass or
ductal dilatation visualized. No pseudocyst formation identified. No
evidence of necrosis.

Spleen:  Within normal limits in size and appearance.

Adrenals/Urinary Tract: Adrenal glands appear normal. A few renal
cortical cysts identified bilaterally measuring up to 2.1 cm on the
left. No hydronephrosis or enhancing renal mass identified
bilaterally.

Stomach/Bowel: Grossly unremarkable.

Vascular/Lymphatic:  No bulky lymphadenopathy visualized.

Other:  Small volume ascites.

Musculoskeletal: No suspicious bony lesions.
IMPRESSION: 1. Moderate to severe acute pancreatitis.
2. Dependent material in the gallbladder likely representing sludge
and possibly small stones. No choledocholithiasis visualized. Common
bile duct is mildly dilated.
3. Hepatic steatosis.
4. Hepatic and renal cysts.
5. Study is limited due to motion.

## 2021-02-18 IMAGING — US US ABDOMEN LIMITED
1 series · 14 of 25 positions shown · non-contrast
Comparison: CT [DATE]

CLINICAL DATA: Upper abdominal pain, nausea and vomiting

EXAM:
ULTRASOUND ABDOMEN LIMITED RIGHT UPPER QUADRANT

[Series 1: us abdomen limited ruq (liver/gb) · 14 of 32 slices shown]
[im 1/32]
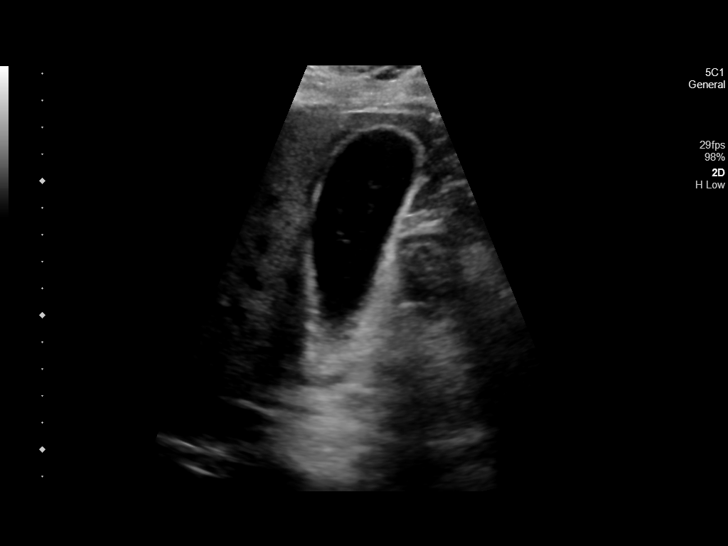
[im 3/32]
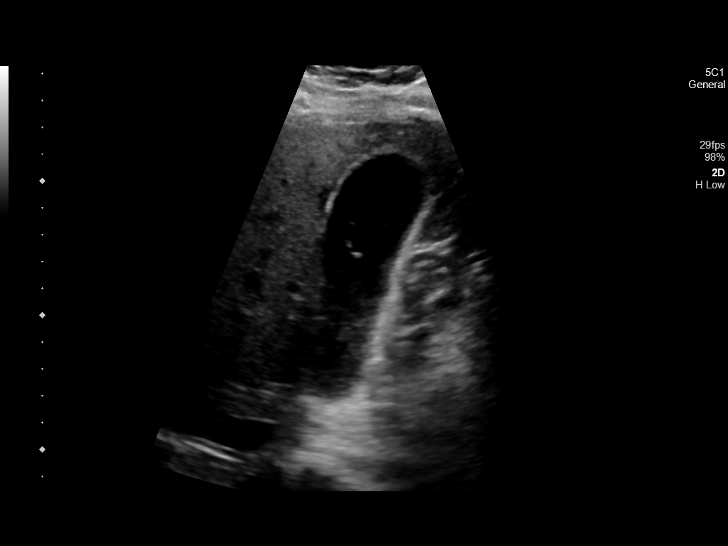
[im 6/32]
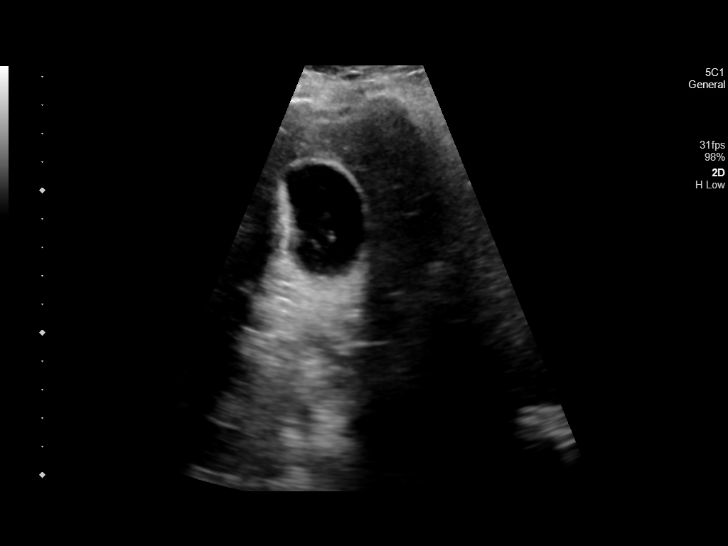
[im 8/32]
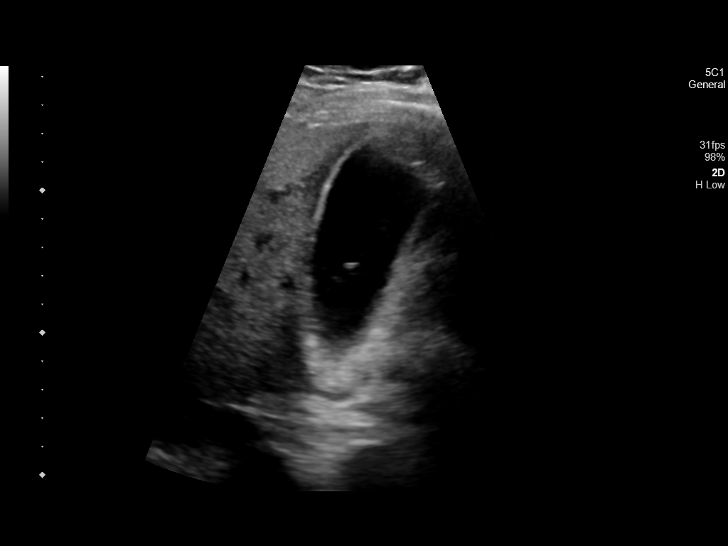
[im 11/32]
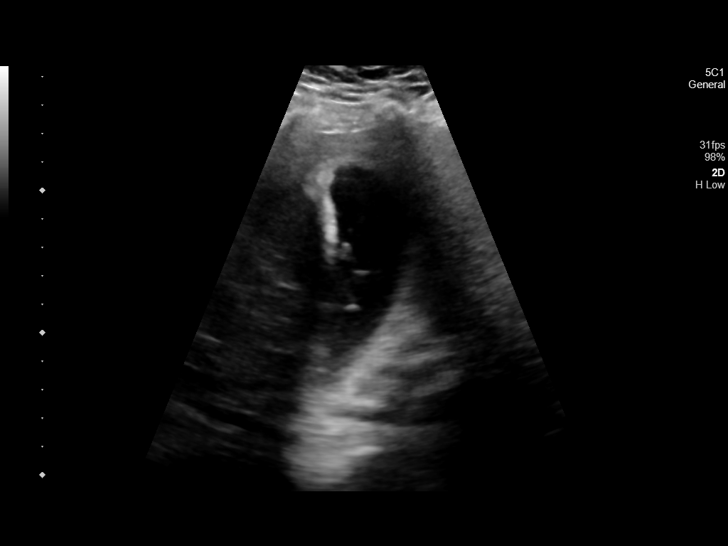
[im 12/32]
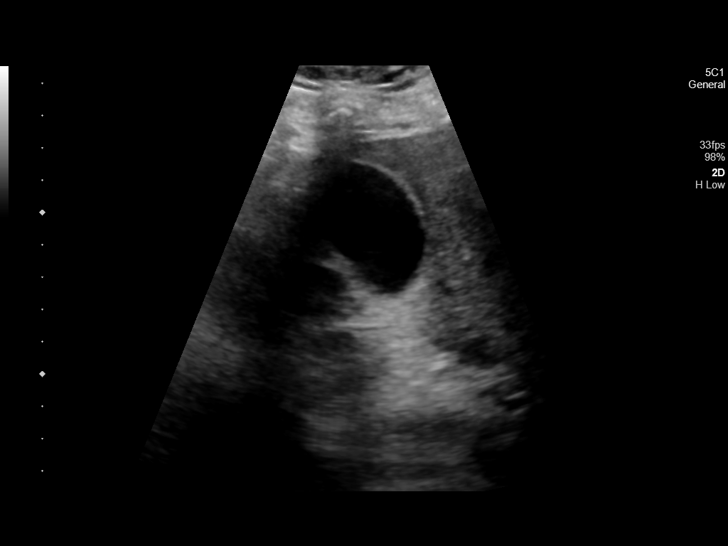
[im 15/32]
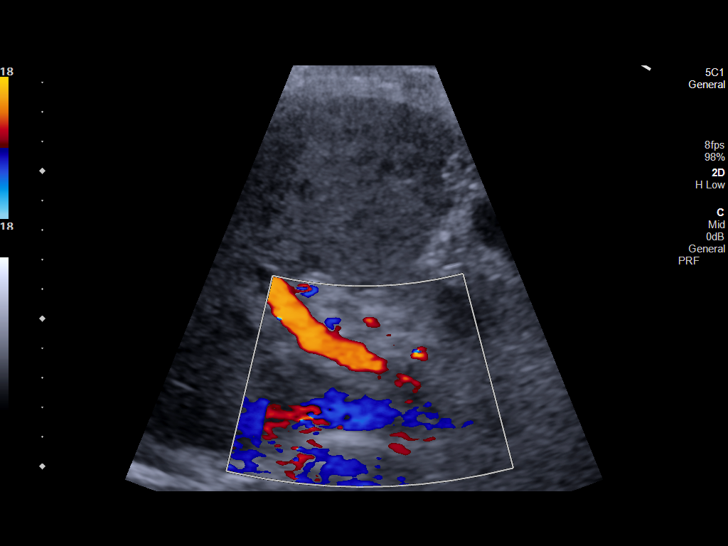
[im 17/32]
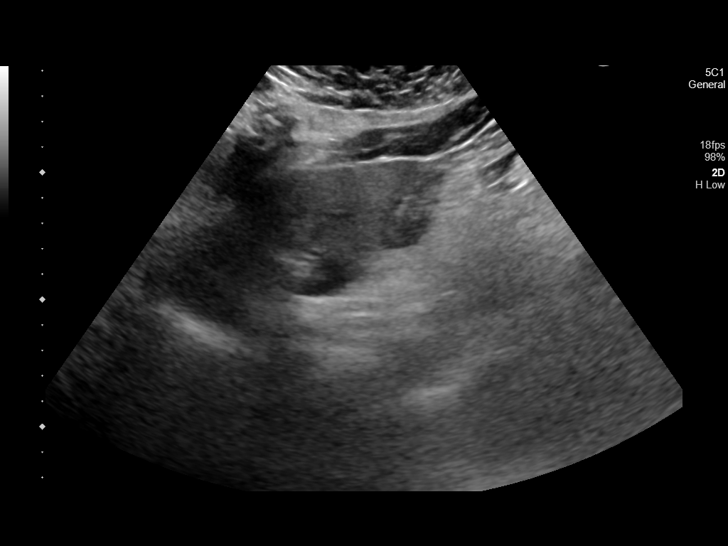
[im 20/32]
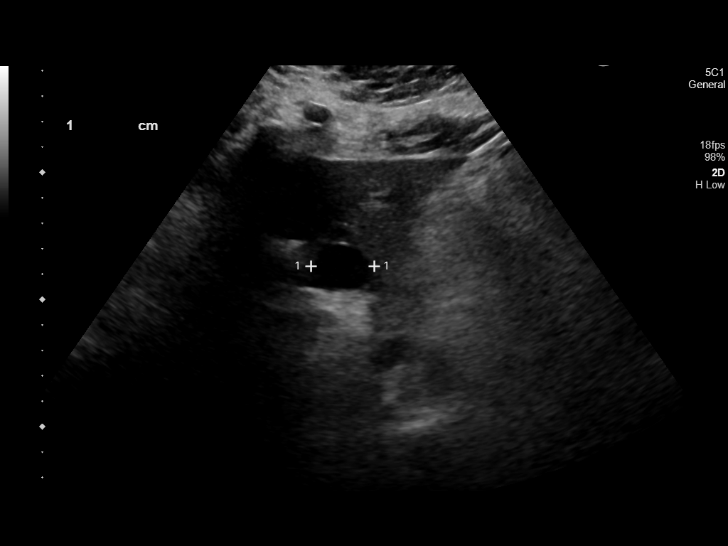
[im 21/32]
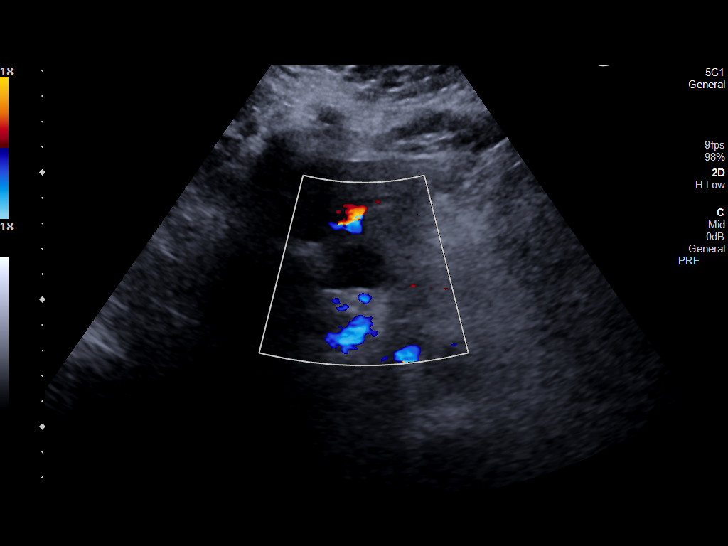
[im 24/32]
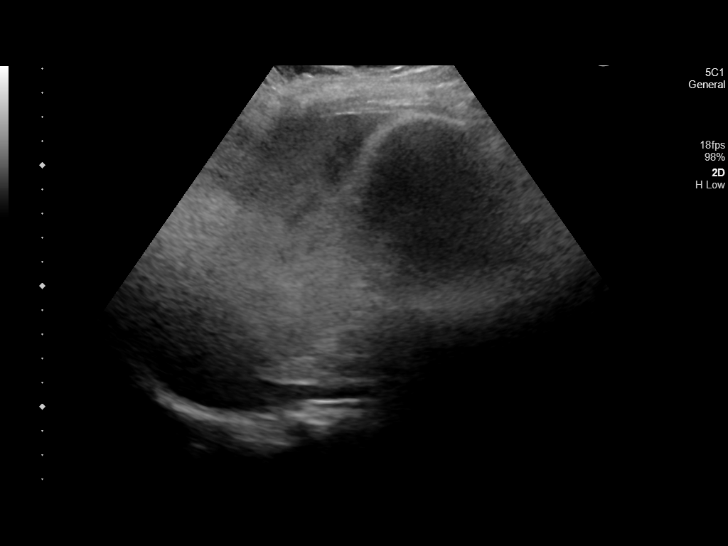
[im 26/32]
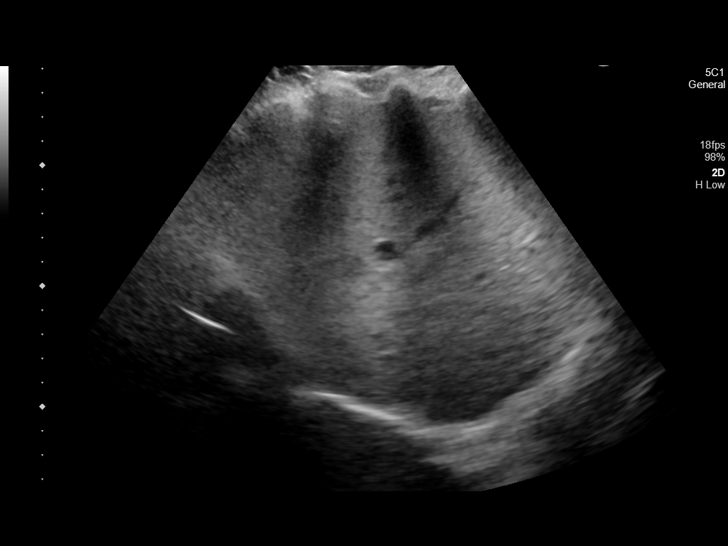
[im 29/32]
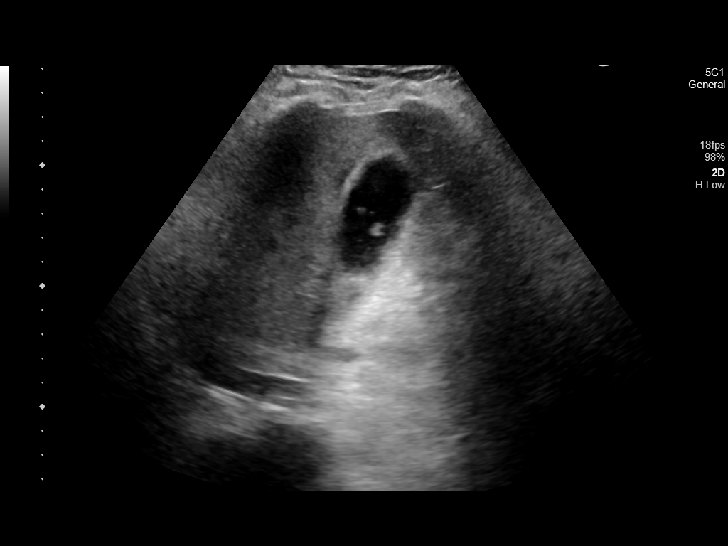
[im 32/32]
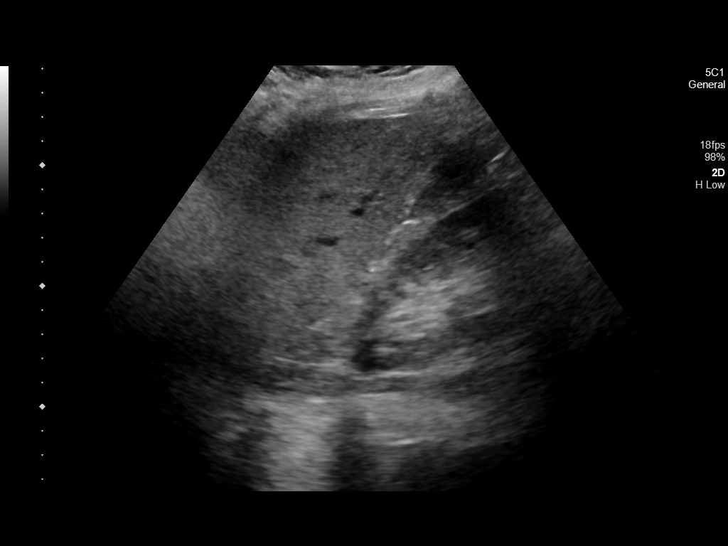

[14 of 25 positions shown; findings below may reference images not displayed]

FINDINGS: Gallbladder:

Sludge is present within the gallbladder lumen. No shadowing
gallstones or wall thickening visualized. No sonographic Murphy sign
noted by sonographer.

Common bile duct:

Diameter: 4 mm.

Liver:

Multiple hepatic cysts within the liver, largest present within the
left hepatic lobe measuring up to 2.5 cm. No focal solid liver
lesion identified. Diffusely increased hepatic parenchymal
echogenicity. Portal vein is patent on color Doppler imaging with
normal direction of blood flow towards the liver.

Other: None.
IMPRESSION: 1. Gallbladder sludge.  No sonographic evidence of cholecystitis.
2. The echogenicity of the liver is increased. This is a nonspecific
finding but is most commonly seen with fatty infiltration of the
liver. There are no obvious solid liver lesions.
3. Multiple hepatic cysts.

## 2021-02-18 MED ORDER — LACTATED RINGERS IV SOLN: Freq: Once | INTRAVENOUS | Status: DC

## 2021-02-18 MED ORDER — MORPHINE SULFATE (PF) 4 MG/ML IV SOLN
INTRAVENOUS | Status: AC
  Administered 2021-02-18: 4 mg via INTRAVENOUS
  Filled 2021-02-18: qty 1

## 2021-02-18 MED ORDER — PANTOPRAZOLE SODIUM 40 MG IV SOLR
40.0000 mg | Freq: Two times a day (BID) | INTRAVENOUS | Status: DC
  Administered 2021-02-18 – 2021-02-25 (×14): 40 mg via INTRAVENOUS
  Filled 2021-02-18 (×14): qty 40

## 2021-02-18 MED ORDER — LABETALOL HCL 5 MG/ML IV SOLN: 10.0000 mg | Freq: Once | INTRAVENOUS | Status: DC

## 2021-02-18 MED ORDER — ONDANSETRON HCL 4 MG/2ML IJ SOLN: 4.0000 mg | Freq: Once | INTRAMUSCULAR | Status: AC

## 2021-02-18 MED ORDER — HYDROMORPHONE HCL 1 MG/ML IJ SOLN
0.5000 mg | INTRAMUSCULAR | Status: DC | PRN
  Administered 2021-02-18 – 2021-02-22 (×19): 0.5 mg via INTRAVENOUS
  Filled 2021-02-18 (×19): qty 1

## 2021-02-18 MED ORDER — LACTATED RINGERS IV BOLUS
1000.0000 mL | Freq: Once | INTRAVENOUS | Status: AC
  Administered 2021-02-18: 1000 mL via INTRAVENOUS

## 2021-02-18 MED ORDER — MORPHINE SULFATE (PF) 4 MG/ML IV SOLN: 4.0000 mg | Freq: Once | INTRAVENOUS | Status: AC

## 2021-02-18 MED ORDER — MORPHINE SULFATE (PF) 2 MG/ML IV SOLN
2.0000 mg | Freq: Once | INTRAVENOUS | Status: AC
  Administered 2021-02-18: 2 mg via INTRAVENOUS
  Filled 2021-02-18 (×2): qty 1

## 2021-02-18 MED ORDER — ENOXAPARIN SODIUM 40 MG/0.4ML IJ SOSY
40.0000 mg | PREFILLED_SYRINGE | INTRAMUSCULAR | Status: DC
  Administered 2021-02-18 – 2021-02-24 (×6): 40 mg via SUBCUTANEOUS
  Filled 2021-02-18 (×6): qty 0.4

## 2021-02-18 MED ORDER — LACTATED RINGERS IV SOLN
INTRAVENOUS | Status: DC
Start: 2021-02-18 — End: 2021-02-22

## 2021-02-18 MED ORDER — ONDANSETRON HCL 4 MG/2ML IJ SOLN
4.0000 mg | Freq: Four times a day (QID) | INTRAMUSCULAR | Status: DC | PRN
  Administered 2021-02-18 – 2021-02-21 (×4): 4 mg via INTRAVENOUS
  Filled 2021-02-18 (×4): qty 2

## 2021-02-18 MED ORDER — LORAZEPAM 2 MG/ML IJ SOLN
1.0000 mg | INTRAMUSCULAR | Status: DC | PRN
  Administered 2021-02-18: 1 mg via INTRAVENOUS
  Filled 2021-02-18: qty 1

## 2021-02-18 MED ORDER — GADOBUTROL 1 MMOL/ML IV SOLN
7.5000 mL | Freq: Once | INTRAVENOUS | Status: AC | PRN
  Administered 2021-02-18: 7.5 mL via INTRAVENOUS
  Filled 2021-02-18: qty 7.5

## 2021-02-18 MED ORDER — PANTOPRAZOLE SODIUM 40 MG IV SOLR: 40.0000 mg | Freq: Two times a day (BID) | INTRAVENOUS | Status: DC

## 2021-02-18 MED ORDER — HYDROMORPHONE HCL 1 MG/ML IJ SOLN
0.5000 mg | Freq: Once | INTRAMUSCULAR | Status: AC
  Administered 2021-02-20: 11:00:00 0.5 mg via INTRAVENOUS
  Filled 2021-02-18: qty 1

## 2021-02-18 MED ORDER — ONDANSETRON HCL 4 MG/2ML IJ SOLN
INTRAMUSCULAR | Status: AC
  Administered 2021-02-18: 4 mg via INTRAVENOUS
  Filled 2021-02-18: qty 2

## 2021-02-18 MED ORDER — LABETALOL HCL 5 MG/ML IV SOLN
5.0000 mg | Freq: Four times a day (QID) | INTRAVENOUS | Status: DC | PRN
  Administered 2021-02-18: 5 mg via INTRAVENOUS
  Filled 2021-02-18: qty 4

## 2021-02-18 NOTE — H&P (Signed)
History and Physical  Linda Clark YBW:[id number] DOB: 1960-12-23 DOA: 02/18/2021  Referring physician: Chesley Noon, MD PCP: Preston Fleeting, MD  Patient coming from: Home  Chief Complaint: Abdominal pain and vomiting  HPI: Linda Clark is a 60 y.o. female with medical history significant for hypertension, hyperlipidemia, CAD and GERD who presents to the emergency department due to abdominal pain and nonbloody vomiting which started yesterday.  Pain was sharp, constant and epigastric with radiation towards middle of her back, it was associated with nausea and several episodes of nonbloody vomiting, no alleviating/aggravating factor known.  She presented to the ED yesterday due to elevated blood pressure and headache and there was concern for subarachnoid hemorrhage, but this was ruled due to negative LP and reassuring CT of head and neck chest and abdomen per ED physician and ED medical record.  Patient was then discharged home, unfortunately, she was unable to tolerate antihypertensive medication due to recurrent vomiting.  She complains of some headache which has improved since arrival to the ED, but denies fever, chills, chest pain, shortness of breath, alcohol use.  ED Course:  In the emergency department, she was intermittently tachypneic, pulse was 66 bpm, BP 179/98, O2 sat 93% on room air.  Work-up in the ED showed elevated H/H at 16.3/50.5, hyperglycemia, transaminitis, lipase 3,473, troponin x1 was negative.  Influenza A, B, SARS coronavirus 2 was negative. Right upper quadrant abdominal pain showed gallbladder sludge with no sonographic evidence of cholecystitis MRCP showed moderate to severe acute pancreatitis.  Dependent material in the gallbladder likely representing sludge and possibly small stones.  No choledocholithiasis visualized, common bile duct is mildly dilated. IV hydration, morphine and Zofran were given.  Ativan was  given and patient desatted to 81% on room air, supplemental oxygen via [place] at 2 LPM was provided with improved O2 sats.   Gastroenterology was consulted and recommended MRCP as described above.  Hospitalist was asked to admit patient for further evaluation and management.  Review of Systems: Constitutional: Negative for chills and fever.  HENT: Negative for ear pain and sore throat.   Eyes: Negative for pain and visual disturbance.  Respiratory: Negative for cough, chest tightness and shortness of breath.   Cardiovascular: Negative for chest pain and palpitations.  Gastrointestinal: Positive for abdominal pain, nausea and vomiting.  Endocrine: Negative for polyphagia and polyuria.  Genitourinary: Negative for decreased urine volume, dysuria, enuresis Musculoskeletal: Negative for arthralgias and back pain.  Skin: Negative for color change and rash.  Allergic/Immunologic: Negative for immunocompromised state.  Neurological: Positive for headache.  Negative for tremors, syncope, speech difficulty, weakness Hematological: Does not bruise/bleed easily.  All other systems reviewed and are negative  Past Medical History:  Diagnosis Date   Hypertension    MI (myocardial infarction) (HCC)    Sinusitis chronic, frontal    Thyroid disease    Past Surgical History:  Procedure Laterality Date   APPENDECTOMY     BACK SURGERY      Social History:  reports that she has never smoked. She has never used smokeless tobacco. She reports that she does not currently use alcohol. She reports that she does not currently use drugs.   Allergies  Allergen Reactions   Aspirin Anaphylaxis   Aspirin-Dipyridamole Er Anaphylaxis    Family History  Problem Relation Age of Onset   Breast cancer Maternal Aunt 73   Breast cancer Cousin 45     Prior to Admission medications  Medication Sig Start Date End Date Taking? Authorizing Provider  amLODipine (NORVASC) 10 MG tablet Take 10 mg by mouth daily.  10/19/20   [provider]  atorvastatin (LIPITOR) 40 MG tablet Take 40 mg by mouth at bedtime. 09/15/20   [provider]  carvedilol (COREG) 25 MG tablet Take 25 mg by mouth 2 (two) times daily with a meal. 11/13/20   [provider]  cetirizine (ZYRTEC) 10 MG tablet Take 10 mg by mouth daily at 12 noon.    [provider]  ezetimibe (ZETIA) 10 MG tablet Take 10 mg by mouth daily. 11/13/20   [provider]  nitroGLYCERIN (NITROSTAT) 0.4 MG SL tablet Place 0.4 mg under the tongue as directed. 09/22/20   [provider]  omeprazole (PRILOSEC) 20 MG capsule Take 20 mg by mouth 2 (two) times daily. 09/15/20   [provider]  valsartan-hydrochlorothiazide (DIOVAN-HCT) 160-25 MG tablet Take 1 tablet by mouth daily.    [provider]    Physical Exam: BP (!) 183/92   Pulse 78   Temp 97.6 F (36.4 C) (Oral)   Resp (!) 25   Ht 5\' 2"  (1.575 m)   Wt 77.7 kg   SpO2 95%   BMI 31.33 kg/m   General: 60 y.o. year-old female well developed well nourished in no acute distress.  Alert and oriented x3. HEENT: Dry mucous membrane.  NCAT, EOMI Neck: Supple, trachea medial Cardiovascular: Regular rate and rhythm with no rubs or gallops.  No thyromegaly or JVD noted.  No lower extremity edema. 2/4 pulses in all 4 extremities. Respiratory: Clear to auscultation with no wheezes or rales. Good inspiratory effort. Abdomen: Soft, tender to palpation in upper quadrants with guarding.Normal bowel sounds x4 quadrants. Muskuloskeletal: No cyanosis, clubbing or edema noted bilaterally Neuro: CN II-XII intact, strength 5/5 x 4, sensation, reflexes intact Skin: No ulcerative lesions noted or rashes Psychiatry:  Mood is appropriate for condition and setting          Labs on Admission:  Basic Metabolic Panel: Recent Labs  Lab 02/17/21 1209 02/18/21 1005  NA 138 138  K 4.4 4.3  CL 102 102  CO2 28 28  GLUCOSE 111* 156*  BUN 17 15   CREATININE 0.55 0.65  CALCIUM 9.5 9.3   Liver Function Tests: Recent Labs  Lab 02/17/21 1209 02/18/21 1005  AST 54* 367*  ALT 35 296*  ALKPHOS 65 85  BILITOT 0.9 3.4*  PROT 8.0 8.3*  ALBUMIN 4.5 4.6   Recent Labs  Lab 02/18/21 1005  LIPASE 3,473*   No results for input(s): AMMONIA in the last 168 hours. CBC: Recent Labs  Lab 02/17/21 1209 02/18/21 1005  WBC 8.9 9.6  NEUTROABS 6.4  --   HGB 16.0* 16.3*  HCT 49.6* 50.5*  MCV 92.5 93.5  PLT 301 341   Cardiac Enzymes: No results for input(s): CKTOTAL, CKMB, CKMBINDEX, TROPONINI in the last 168 hours.  BNP (last 3 results) No results for input(s): BNP in the last 8760 hours.  ProBNP (last 3 results) No results for input(s): PROBNP in the last 8760 hours.  CBG: No results for input(s): GLUCAP in the last 168 hours.  Radiological Exams on Admission: CT ANGIO HEAD NECK W WO CM  Result Date: 02/17/2021 CLINICAL DATA:  Same-day noncontrast CT head EXAM: CT ANGIOGRAPHY HEAD AND NECK TECHNIQUE: Multidetector CT imaging of the head and neck was performed using the standard protocol during bolus administration of intravenous contrast. Multiplanar CT image reconstructions  and MIPs were obtained to evaluate the vascular anatomy. Carotid stenosis measurements (when applicable) are obtained utilizing NASCET criteria, using the distal internal carotid diameter as the denominator. CONTRAST:  165mL OMNIPAQUE IOHEXOL 350 MG/ML SOLN COMPARISON:  Same-day noncontrast CT head. FINDINGS: CTA NECK FINDINGS Aortic arch: The left vertebral artery arises directly from the aortic arch, a normal variant. Imaged portion shows no evidence of aneurysm or dissection. No significant stenosis of the major arch vessel origins. Right carotid system: The right common, internal, and external carotid arteries are patent, without hemodynamically significant stenosis, occlusion, dissection, or aneurysm. The right internal carotid artery has a medialized  retropharyngeal course. Left carotid system: The left common, internal, and external carotid arteries are patent, without hemodynamically significant stenosis, occlusion, dissection, or aneurysm. Vertebral arteries: The right vertebral artery is dominant with a diminutive left vertebral artery., a normal variant. The vertebral arteries are patent, without hemodynamically significant stenosis, occlusion, dissection, or aneurysm. Skeleton: There is mild degenerative change at C5-C6. There is no visible canal hematoma. There is no acute osseous abnormality or aggressive osseous lesion. Other neck: The soft tissues are unremarkable. Upper chest: The imaged lung apices are clear. Review of the MIP images confirms the above findings CTA HEAD FINDINGS Anterior circulation: The bilateral cavernous ICAs are patent with minimal plaque in the left supraclinoid segment. The bilateral MCAs are patent. The bilateral ACAs are patent. There is no aneurysm. Posterior circulation: The left V4 segment is markedly diminutive after the PICA origin, likely developmental variant. The prominent right V4 segment is patent. The basilar artery is patent. The bilateral PCAs are patent. There is no aneurysm. Venous sinuses: Patent. Anatomic variants: None. Review of the MIP images confirms the above findings IMPRESSION: 1. No aneurysm identified. 2. Patent vasculature of the head and neck, with no hemodynamically significant stenosis, occlusion, or dissection. Electronically Signed   By: Valetta Mole M.D.   On: 02/17/2021 14:02   CT Head Wo Contrast  Result Date: 02/17/2021 CLINICAL DATA:  Headache EXAM: CT HEAD WITHOUT CONTRAST TECHNIQUE: Contiguous axial images were obtained from the base of the skull through the vertex without intravenous contrast. COMPARISON:  None. FINDINGS: Brain: Small volume subarachnoid hemorrhage seen over the left frontal and occipital lobes. No evidence of acute infarction, hydrocephalus, extra-axial collection  or mass lesion/mass effect. Vascular: No hyperdense vessel or unexpected calcification. Skull: Normal. Negative for fracture or focal lesion. Sinuses/Orbits: Mucosal thickening of the ethmoid and left maxillary sinus. No acute abnormality. Other: None. IMPRESSION: Small volume subarachnoid hemorrhage seen over the left frontal and occipital lobes. Critical Value/emergent results were called by telephone at the time of interpretation on 02/17/2021 at 12:50 pm to provider JENISE MENSHEW , who verbally acknowledged these results. Electronically Signed   By: Yetta Glassman M.D.   On: 02/17/2021 12:50   MR 3D Recon At Scanner  Result Date: 02/18/2021 CLINICAL DATA:  Right upper quadrant pain EXAM: MRI ABDOMEN WITHOUT AND WITH CONTRAST (INCLUDING MRCP) TECHNIQUE: Multiplanar multisequence MR imaging of the abdomen was performed both before and after the administration of intravenous contrast. Heavily T2-weighted images of the biliary and pancreatic ducts were obtained, and three-dimensional MRCP images were rendered by post processing. CONTRAST:  7.47mL GADAVIST GADOBUTROL 1 MMOL/ML IV SOLN COMPARISON:  CT abdomen and pelvis 02/17/2021, ultrasound abdomen 02/18/2021 FINDINGS: Study is significantly limited due to motion. Lower chest: No acute findings. Hepatobiliary: Liver is upper normal size measuring 18 cm in length. There is evidence of hepatic steatosis. Several hyperintense T2 signal  hepatic cysts are identified measuring up to 2.6 cm in size. Layering hypointense material in the gallbladder likely representing sludge and possible small stones. No significant gallbladder wall thickening identified. No choledocholithiasis identified. Common bile duct is mildly dilated measuring 7 mm in diameter. No significant intrahepatic ductal dilatation visualized. Pancreas: Diffuse pancreatic edema and extensive peripancreatic edema consistent with acute pancreatitis. No pancreatic mass or ductal dilatation visualized. No  pseudocyst formation identified. No evidence of necrosis. Spleen:  Within normal limits in size and appearance. Adrenals/Urinary Tract: Adrenal glands appear normal. A few renal cortical cysts identified bilaterally measuring up to 2.1 cm on the left. No hydronephrosis or enhancing renal mass identified bilaterally. Stomach/Bowel: Grossly unremarkable. Vascular/Lymphatic:  No bulky lymphadenopathy visualized. Other:  Small volume ascites. Musculoskeletal: No suspicious bony lesions. IMPRESSION: 1. Moderate to severe acute pancreatitis. 2. Dependent material in the gallbladder likely representing sludge and possibly small stones. No choledocholithiasis visualized. Common bile duct is mildly dilated. 3. Hepatic steatosis. 4. Hepatic and renal cysts. 5. Study is limited due to motion. Electronically Signed   By: Ofilia Neas M.D.   On: 02/18/2021 15:03   MR ABDOMEN MRCP W WO CONTAST  Result Date: 02/18/2021 CLINICAL DATA:  Right upper quadrant pain EXAM: MRI ABDOMEN WITHOUT AND WITH CONTRAST (INCLUDING MRCP) TECHNIQUE: Multiplanar multisequence MR imaging of the abdomen was performed both before and after the administration of intravenous contrast. Heavily T2-weighted images of the biliary and pancreatic ducts were obtained, and three-dimensional MRCP images were rendered by post processing. CONTRAST:  7.76mL GADAVIST GADOBUTROL 1 MMOL/ML IV SOLN COMPARISON:  CT abdomen and pelvis 02/17/2021, ultrasound abdomen 02/18/2021 FINDINGS: Study is significantly limited due to motion. Lower chest: No acute findings. Hepatobiliary: Liver is upper normal size measuring 18 cm in length. There is evidence of hepatic steatosis. Several hyperintense T2 signal hepatic cysts are identified measuring up to 2.6 cm in size. Layering hypointense material in the gallbladder likely representing sludge and possible small stones. No significant gallbladder wall thickening identified. No choledocholithiasis identified. Common bile  duct is mildly dilated measuring 7 mm in diameter. No significant intrahepatic ductal dilatation visualized. Pancreas: Diffuse pancreatic edema and extensive peripancreatic edema consistent with acute pancreatitis. No pancreatic mass or ductal dilatation visualized. No pseudocyst formation identified. No evidence of necrosis. Spleen:  Within normal limits in size and appearance. Adrenals/Urinary Tract: Adrenal glands appear normal. A few renal cortical cysts identified bilaterally measuring up to 2.1 cm on the left. No hydronephrosis or enhancing renal mass identified bilaterally. Stomach/Bowel: Grossly unremarkable. Vascular/Lymphatic:  No bulky lymphadenopathy visualized. Other:  Small volume ascites. Musculoskeletal: No suspicious bony lesions. IMPRESSION: 1. Moderate to severe acute pancreatitis. 2. Dependent material in the gallbladder likely representing sludge and possibly small stones. No choledocholithiasis visualized. Common bile duct is mildly dilated. 3. Hepatic steatosis. 4. Hepatic and renal cysts. 5. Study is limited due to motion. Electronically Signed   By: Ofilia Neas M.D.   On: 02/18/2021 15:03   CT Angio Chest/Abd/Pel for Dissection W and/or Wo Contrast  Result Date: 02/17/2021 CLINICAL DATA:  Hypertension. Small subarachnoid hemorrhage on head CT. EXAM: CT ANGIOGRAPHY CHEST, ABDOMEN AND PELVIS TECHNIQUE: Non-contrast CT of the chest was initially obtained. Multidetector CT imaging through the chest, abdomen and pelvis was performed using the standard protocol during bolus administration of intravenous contrast. Multiplanar reconstructed images and MIPs were obtained and reviewed to evaluate the vascular anatomy. CONTRAST:  129mL OMNIPAQUE IOHEXOL 350 MG/ML SOLN COMPARISON:  None. FINDINGS: CTA CHEST FINDINGS Cardiovascular: Preferential opacification  of the thoracic aorta. No evidence of thoracic aortic aneurysm or dissection. Normal heart size. No pericardial effusion. No central  pulmonary embolism. Mediastinum/Nodes: No enlarged mediastinal, hilar, or axillary lymph nodes. Thyroid gland, trachea, and esophagus demonstrate no significant findings. Lungs/Pleura: Lungs are clear. No pleural effusion or pneumothorax. Musculoskeletal: No chest wall abnormality. No acute or significant osseous findings. Review of the MIP images confirms the above findings. CTA ABDOMEN AND PELVIS FINDINGS VASCULAR Aorta: Normal caliber aorta without aneurysm, dissection, vasculitis or significant stenosis. Mild atherosclerotic calcification. Celiac: Patent without evidence of aneurysm, dissection, vasculitis or significant stenosis. SMA: Patent without evidence of aneurysm, dissection, vasculitis or significant stenosis. Renals: Both renal arteries are patent without evidence of aneurysm, dissection, vasculitis, fibromuscular dysplasia or significant stenosis. IMA: Patent without evidence of aneurysm, dissection, vasculitis or significant stenosis. Inflow: Patent without evidence of aneurysm, dissection, vasculitis or significant stenosis. Veins: No obvious venous abnormality within the limitations of this arterial phase study. Review of the MIP images confirms the above findings. NON-VASCULAR Hepatobiliary: Scattered hepatic simple cysts measuring up to 2.6 cm. Other subcentimeter low-density lesions within the liver are too small to characterize. Gallbladder sludge. No gallbladder wall thickening or biliary dilatation. Pancreas: Unremarkable. No pancreatic ductal dilatation or surrounding inflammatory changes. Spleen: Normal in size without focal abnormality. Adrenals/Urinary Tract: Adrenal glands are unremarkable. Bilateral renal simple cysts measuring up to 2.2 cm. No renal calculi or hydronephrosis. The bladder is unremarkable. Stomach/Bowel: Small hiatal hernia. The stomach is otherwise within normal limits. No bowel wall thickening, distention, or surrounding inflammatory changes. Lymphatic: No enlarged  abdominal or pelvic lymph nodes. Reproductive: Uterus and bilateral adnexa are unremarkable. Other: No abdominal wall hernia or abnormality. No abdominopelvic ascites. No pneumoperitoneum. Musculoskeletal: No acute or significant osseous findings. Review of the MIP images confirms the above findings. IMPRESSION: 1. No evidence of acute aortic syndrome. No aneurysm. 2. Gallbladder sludge. 3.  Aortic atherosclerosis (ICD10-I70.0). Electronically Signed   By: Obie DredgeWilliam T Derry M.D.   On: 02/17/2021 14:03   US Abdomen Limited RUQ (LIVER/GB)  Result Date: 02/18/2021 CLINICAL DATA:  Upper abdominal pain, nausea and vomiting EXAM: ULTRASOUND ABDOMEN LIMITED RIGHT UPPER QUADRANT COMPARISON:  CT 02/17/2021 FINDINGS: Gallbladder: Sludge is present within the gallbladder lumen. No shadowing gallstones or wall thickening visualized. No sonographic Murphy sign noted by sonographer. Common bile duct: Diameter: 4 mm. Liver: Multiple hepatic cysts within the liver, largest present within the left hepatic lobe measuring up to 2.5 cm. No focal solid liver lesion identified. Diffusely increased hepatic parenchymal echogenicity. Portal vein is patent on color Doppler imaging with normal direction of blood flow towards the liver. Other: None. IMPRESSION: 1. Gallbladder sludge.  No sonographic evidence of cholecystitis. 2. The echogenicity of the liver is increased. This is a nonspecific finding but is most commonly seen with fatty infiltration of the liver. There are no obvious solid liver lesions. 3. Multiple hepatic cysts. Electronically Signed   By: Duanne GuessNicholas  Plundo D.O.   On: 02/18/2021 12:35    EKG: I independently viewed the EKG done and my findings are as followed: Normal sinus rhythm at a rate of 75 bpm with T wave inversion in lead III and V1 (similar to EKG done on 11/23)  Assessment/Plan Present on Admission:  Acute pancreatitis  Principal Problem:   Acute pancreatitis Active Problems:   Abdominal pain   Nausea  & vomiting   Elevated lipase   Hyperglycemia   Transaminitis   Biliary sludge determined by ultrasound   Hepatic steatosis  Hypertensive urgency   Obesity (BMI 30.0-34.9)   Essential hypertension   Mixed hyperlipidemia   CAD (coronary artery disease)  Abdominal pain, nausea and vomiting possibly secondary to acute pancreatitis BISAP Score = 0 points (<1 % risk of mortality) Continue IV Zofran p.r.n Continue IV Dilaudid p.r.n for pain Continue Protonix Continue IV LR at 171ml/Hr Continue n.p.o. at this time (due to vomiting) with plan to  advance diet as tolerated RUQ U/S showed gallbladder sludge with no sonographic evidence of cholecystitis Gastroenterology was consulted by ED physician, MRCP was done and showed no choledocholithiasis.  GI will continue to follow-up per ED physician  Elevated lipase and transaminitis possibly secondary to above Lipase 3,473, AST 367, ALT 296 Patient denies history of alcohol use Triglyceride levels will be checked Continue management as described above  Hypertensive urgency Essential hypertension Patient has not been able to take home meds due to recurrent vomiting Continue IV labetalol as needed  Dehydration Continue IV hydration  Biliary sludge/Hepatic steatosis Continue to monitor  Hyperglycemia  CBG 156; A1c was 5.7 in June 2022 (per medical record care everywhere) Continue diet control  CAD/mixed hyperlipidemia No aspirin noted on patient's home med rec Statin will be held at this time due to elevated liver enzymes  Obesity (31.33 kg/m) Patient will be advised on diet and lifestyle medication when more stable  DVT prophylaxis: Lovenox  Code Status: Full code  Family Communication: None at bedside  Disposition Plan:  Patient is from:                        home Anticipated DC to:                   SNF or family members home Anticipated DC date:               2-3 days Anticipated DC barriers:          Patient requires  inpatient management due to acute pancreatitis, nausea and vomiting requiring inpatient management  Consults called: Gastroenterology (by ED team)  Admission status: Inpatient    Frankey Shown MD [facility] 02/18/2021, 4:20 PM

## 2021-02-18 NOTE — ED Notes (Signed)
Report received from Joy, RN 

## 2021-02-18 NOTE — ED Provider Notes (Signed)
[facility] Emergency Department Provider Note   ____________________________________________   Event Date/Time   First MD Initiated Contact with Patient 02/18/21 1122     (approximate)  I have reviewed the triage vital signs and the nursing notes.   HISTORY  Chief Complaint Abdominal Pain and Emesis    HPI Linda Clark is a 60 y.o. female with past medical history of hypertension, hyperlipidemia, and CAD who presents to the ED complaining of abdominal pain and vomiting.  History is limited as patient is Spanish-speaking only and history obtained via in person interpreter.  Patient reports that she has been developing gradually worsening pain in her upper abdomen over the past 12 hours.  She describes pain as sharp and severe, radiating towards the middle of her back.  Pain is constant, not exacerbated or alleviated by anything in particular.  It is been associated with nausea and multiple episodes of vomiting, but she denies any diarrhea or urinary symptoms.  She has not had any fevers and denies any chest pain, cough, or shortness of breath.  She was seen in the ED yesterday for headache and elevated blood pressure, there was concern for SAH at that time but CTA of her head, neck, chest, and abdomen was reassuring and LP was negative for blood.  She was subsequently discharged home but states she has been unable to tolerate her blood pressure medications since then.  She complains of mild headache at this time but states her abdominal pain is much more severe.        Past Medical History:  Diagnosis Date   Hypertension    MI (myocardial infarction) (HCC)    Sinusitis chronic, frontal    Thyroid disease     Patient Active Problem List   Diagnosis Date Noted   Acute pancreatitis 02/18/2021    Past Surgical History:  Procedure Laterality Date   APPENDECTOMY     BACK SURGERY      Prior to Admission medications   Medication  Sig Start Date End Date Taking? Authorizing Provider  amLODipine (NORVASC) 10 MG tablet Take 10 mg by mouth daily. 10/19/20   [provider]  atorvastatin (LIPITOR) 40 MG tablet Take 40 mg by mouth at bedtime. 09/15/20   [provider]  carvedilol (COREG) 25 MG tablet Take 25 mg by mouth 2 (two) times daily with a meal. 11/13/20   [provider]  cetirizine (ZYRTEC) 10 MG tablet Take 10 mg by mouth daily at 12 noon.    [provider]  ezetimibe (ZETIA) 10 MG tablet Take 10 mg by mouth daily. 11/13/20   [provider]  nitroGLYCERIN (NITROSTAT) 0.4 MG SL tablet Place 0.4 mg under the tongue as directed. 09/22/20   [provider]  omeprazole (PRILOSEC) 20 MG capsule Take 20 mg by mouth 2 (two) times daily. 09/15/20   [provider]  valsartan-hydrochlorothiazide (DIOVAN-HCT) 160-25 MG tablet Take 1 tablet by mouth daily.    [provider]    Allergies Aspirin and Aspirin-dipyridamole er  Family History  Problem Relation Age of Onset   Breast cancer Maternal Aunt 75   Breast cancer Cousin 42    Social History Social History   Tobacco Use   Smoking status: Never   Smokeless tobacco: Never  Substance Use Topics   Alcohol use: Not Currently   Drug use: Not Currently    Review of Systems  Constitutional: No fever/chills Eyes: No visual changes. ENT: No  sore throat. Cardiovascular: Denies chest pain. Respiratory: Denies shortness of breath. Gastrointestinal: Positive for abdominal pain, nausea, and vomiting.  No diarrhea.  No constipation. Genitourinary: Negative for dysuria. Musculoskeletal: Positive for back pain. Skin: Negative for rash. Neurological: Positive for headache, negative for focal weakness or numbness.  ____________________________________________   PHYSICAL EXAM:  VITAL SIGNS: ED Triage Vitals  Enc Vitals Group     BP 02/18/21 1004 (!) 182/123     Pulse Rate 02/18/21 1004 81      Resp 02/18/21 1004 20     Temp 02/18/21 1004 97.6 F (36.4 C)     Temp Source 02/18/21 1004 Oral     SpO2 02/18/21 1004 93 %     Weight 02/18/21 0953 171 lb 4.8 oz (77.7 kg)     Height 02/18/21 0953 5\' 2"  (1.575 m)     Head Circumference --      Peak Flow --      Pain Score 02/18/21 0952 10     Pain Loc --      Pain Edu? --      Excl. in GC? --     Constitutional: Alert and oriented, very uncomfortable appearing and clutching her upper abdomen. Eyes: Conjunctivae are normal. Head: Atraumatic. Nose: No congestion/rhinnorhea. Mouth/Throat: Mucous membranes are moist. Neck: Normal ROM Cardiovascular: Normal rate, regular rhythm. Grossly normal heart sounds.  2+ radial pulses bilaterally. Respiratory: Normal respiratory effort.  No retractions. Lungs CTAB. Gastrointestinal: Soft and tender to palpation in bilateral upper quadrants with voluntary guarding. No distention. Genitourinary: deferred Musculoskeletal: No lower extremity tenderness nor edema. Neurologic:  Normal speech and language. No gross focal neurologic deficits are appreciated. Skin:  Skin is warm, dry and intact. No rash noted. Psychiatric: Mood and affect are normal. Speech and behavior are normal.  ____________________________________________   LABS (all labs ordered are listed, but only abnormal results are displayed)  Labs Reviewed  LIPASE, BLOOD - Abnormal; Notable for the following components:      Result Value   Lipase 3,473 (*)    All other components within normal limits  COMPREHENSIVE METABOLIC PANEL - Abnormal; Notable for the following components:   Glucose, Bld 156 (*)    Total Protein 8.3 (*)    AST 367 (*)    ALT 296 (*)    Total Bilirubin 3.4 (*)    All other components within normal limits  CBC - Abnormal; Notable for the following components:   RBC 5.40 (*)    Hemoglobin 16.3 (*)    HCT 50.5 (*)    All other components within normal limits  URINALYSIS, ROUTINE W REFLEX MICROSCOPIC -  Abnormal; Notable for the following components:   Color, Urine AMBER (*)    APPearance CLOUDY (*)    Hgb urine dipstick SMALL (*)    Bilirubin Urine SMALL (*)    Bacteria, UA MANY (*)    All other components within normal limits  RESP PANEL BY RT-PCR (FLU A&B, COVID) ARPGX2  TROPONIN I (HIGH SENSITIVITY)   ____________________________________________  EKG  ED ECG REPORT I, 02/20/21, the attending physician, personally viewed and interpreted this ECG.   Date: 02/18/2021  EKG Time: 12:05  Rate: 75  Rhythm: normal sinus rhythm  Axis: LAD  Intervals:none  ST&T Change: None   PROCEDURES  Procedure(s) performed (including Critical Care):  Procedures   ____________________________________________   INITIAL IMPRESSION / ASSESSMENT AND PLAN / ED COURSE      60 year old female with past medical history of hypertension,  hyperlipidemia, and CAD who presents to the ED complaining of gradually worsening and now severe pain in her upper abdomen that radiates towards her back, associated with nausea and multiple episodes of vomiting.  She is significantly tender to palpation in her upper abdomen and initial labs are concerning for transaminitis along with elevated bilirubin, lipase is pending.  CT yesterday was notable for gallbladder sludge and I am now concerned about cholecystitis versus choledocholithiasis.  We will further assess with right upper quadrant ultrasound, treat symptomatically with IV morphine and Zofran.  Patient noted to be hypertensive with mild headache, no focal neurologic deficits noted.  She had complete work-up for [facility] yesterday and while initial noncontrast CT head showed possible small area of hemorrhage, CTA was negative and LP showed no blood, SAH essentially ruled out.  Given she has been unable to tolerate her home antihypertensives, we will give dose of IV labetalol.  Patient's blood pressure improved without IV labetalol, pain improving following IV  morphine and no further vomiting.  Right upper quadrant ultrasound shows biliary sludge but no evidence of cholecystitis and CBD within normal limits.  Lipase noted to be markedly elevated and symptoms consistent with a pancreatitis.  Findings discussed with Dr. Servando Snare of GI, who recommends proceeding with MRCP to rule out choledocholithiasis.  This was performed and shows borderline enlarged CBD at 7 mm but no evidence of choledocholithiasis, does show significant pancreatitis without apparent complication.  Cause of pancreatitis is unclear at this time as patient denies alcohol abuse.  Case discussed with hospitalist for admission.      ____________________________________________   FINAL CLINICAL IMPRESSION(S) / ED DIAGNOSES  Final diagnoses:  RUQ pain  Acute pancreatitis without infection or necrosis, unspecified pancreatitis type     ED Discharge Orders     None        Note:  This document was prepared using Dragon voice recognition software and may include unintentional dictation errors.    Chesley Noon, MD 02/18/21 [phone]

## 2021-02-18 NOTE — ED Notes (Signed)
Request made for transport to the floor ?

## 2021-02-18 NOTE — Progress Notes (Signed)
PHARMACIST - PHYSICIAN COMMUNICATION  CONCERNING:  Enoxaparin (Lovenox) for DVT Prophylaxis    RECOMMENDATION: Patient was prescribed enoxaprin 40mg  q24 hours for VTE prophylaxis.   Filed Weights   02/18/21 0953  Weight: 77.7 kg (171 lb 4.8 oz)    Body mass index is 31.33 kg/m.  Estimated Creatinine Clearance: 73.9 mL/min (by C-G formula based on SCr of 0.65 mg/dL).   Based on [facility] policy patient is candidate for enoxaparin 0.5mg /kg TBW SQ every 24 hours based on BMI being >30.  *Calculated dose will be 39mg ; therefore will continue with current 40mg  dose*  DESCRIPTION: Pharmacy has adjusted enoxaparin dose per [facility] policy.   Tysheka Fanguy Rodriguez-Guzman PharmD, BCPS 02/18/2021 5:44 PM

## 2021-02-18 NOTE — ED Triage Notes (Signed)
Pt comes into the ED via POV c/o abd pain and emesis that started yesterday afternoon.  Pt states she was seen yesterday for HTN as well.  Pt describes the pain as sharp pain in the epigastric area.

## 2021-02-18 NOTE — ED Notes (Signed)
After receiving ativan, pt. Desat to 81%, pt. Placed on 2L O2 [place], dr. Larinda Buttery notified. Dr. Larinda Buttery also notified of pt's BP.

## 2021-02-19 DIAGNOSIS — K5900 Constipation, unspecified: Secondary | ICD-10-CM

## 2021-02-19 DIAGNOSIS — K859 Acute pancreatitis without necrosis or infection, unspecified: Secondary | ICD-10-CM

## 2021-02-19 DIAGNOSIS — K851 Biliary acute pancreatitis without necrosis or infection: Secondary | ICD-10-CM

## 2021-02-19 LAB — COMPREHENSIVE METABOLIC PANEL
ALT: 301 U/L — ABNORMAL HIGH (ref 0–44)
AST: 225 U/L — ABNORMAL HIGH (ref 15–41)
Albumin: 3.9 g/dL (ref 3.5–5.0)
Alkaline Phosphatase: 82 U/L (ref 38–126)
Anion gap: 8 (ref 5–15)
BUN: 18 mg/dL (ref 6–20)
CO2: 27 mmol/L (ref 22–32)
Calcium: 8.8 mg/dL — ABNORMAL LOW (ref 8.9–10.3)
Chloride: 103 mmol/L (ref 98–111)
Creatinine, Ser: 0.43 mg/dL — ABNORMAL LOW (ref 0.44–1.00)
GFR, Estimated: >60 mL/min (ref >60)
Glucose, Bld: 122 mg/dL — ABNORMAL HIGH (ref 70–99)
Potassium: 3.3 mmol/L — ABNORMAL LOW (ref 3.5–5.1)
Sodium: 138 mmol/L (ref 135–145)
Total Bilirubin: 2.1 mg/dL — ABNORMAL HIGH (ref 0.3–1.2)
Total Protein: 7 g/dL (ref 6.5–8.1)

## 2021-02-19 LAB — CBC
HCT: 48 % — ABNORMAL HIGH (ref 36.0–46.0)
Hemoglobin: 15.6 g/dL — ABNORMAL HIGH (ref 12.0–15.0)
MCH: 30.2 pg (ref 26.0–34.0)
MCHC: 32.5 g/dL (ref 30.0–36.0)
MCV: 92.8 fL (ref 80.0–100.0)
Platelets: 291 10*3/uL (ref 150–400)
RBC: 5.17 MIL/uL — ABNORMAL HIGH (ref 3.87–5.11)
RDW: 13.1 % (ref 11.5–15.5)
WBC: 14 10*3/uL — ABNORMAL HIGH (ref 4.0–10.5)
nRBC: 0 % (ref 0.0–0.2)

## 2021-02-19 LAB — MAGNESIUM: Magnesium: 2.4 mg/dL (ref 1.7–2.4)

## 2021-02-19 LAB — HIV ANTIBODY (ROUTINE TESTING W REFLEX): HIV Screen 4th Generation wRfx: NONREACTIVE

## 2021-02-19 LAB — LIPASE, BLOOD: Lipase: 978 U/L — ABNORMAL HIGH (ref 11–51)

## 2021-02-19 LAB — APTT: aPTT: 29 seconds (ref 24–36)

## 2021-02-19 LAB — TRIGLYCERIDES: Triglycerides: 100 mg/dL (ref <150)

## 2021-02-19 LAB — PHOSPHORUS: Phosphorus: 3.6 mg/dL (ref 2.5–4.6)

## 2021-02-19 MED ORDER — LABETALOL HCL 5 MG/ML IV SOLN
5.0000 mg | INTRAVENOUS | Status: DC
  Administered 2021-02-19 – 2021-02-21 (×12): 5 mg via INTRAVENOUS
  Filled 2021-02-19 (×9): qty 4

## 2021-02-19 MED ORDER — FLEET ENEMA 7-19 GM/118ML RE ENEM
1.0000 | ENEMA | Freq: Once | RECTAL | Status: AC
  Administered 2021-02-19: 18:00:00 1 via RECTAL

## 2021-02-19 MED ORDER — ALBUMIN HUMAN 25 % IV SOLN
25.0000 g | Freq: Once | INTRAVENOUS | Status: AC
  Administered 2021-02-20: 25 g via INTRAVENOUS
  Filled 2021-02-19: qty 100

## 2021-02-19 MED ORDER — ADULT MULTIVITAMIN W/MINERALS CH
1.0000 | ORAL_TABLET | Freq: Every day | ORAL | Status: DC
  Administered 2021-02-19 – 2021-02-25 (×6): 1 via ORAL
  Filled 2021-02-19 (×6): qty 1

## 2021-02-19 MED ORDER — BOOST / RESOURCE BREEZE PO LIQD CUSTOM
1.0000 | Freq: Three times a day (TID) | ORAL | Status: DC
  Administered 2021-02-19 – 2021-02-21 (×4): 1 via ORAL

## 2021-02-19 MED ORDER — PROSOURCE PLUS PO LIQD
30.0000 mL | Freq: Three times a day (TID) | ORAL | Status: DC
  Administered 2021-02-19 – 2021-02-21 (×4): 30 mL via ORAL
  Filled 2021-02-19 (×11): qty 30

## 2021-02-19 MED ORDER — CHLORHEXIDINE GLUCONATE CLOTH 2 % EX PADS
6.0000 | MEDICATED_PAD | Freq: Every day | CUTANEOUS | Status: DC
  Administered 2021-02-20 – 2021-02-25 (×6): 6 via TOPICAL

## 2021-02-19 MED ORDER — SODIUM CHLORIDE 0.9 % IV SOLN
2.0000 g | INTRAVENOUS | Status: DC
  Administered 2021-02-19: 20:00:00 2 g via INTRAVENOUS
  Filled 2021-02-19 (×2): qty 20

## 2021-02-19 MED ORDER — POTASSIUM CHLORIDE 10 MEQ/100ML IV SOLN
10.0000 meq | INTRAVENOUS | Status: AC
  Administered 2021-02-19 (×4): 10 meq via INTRAVENOUS
  Filled 2021-02-19 (×3): qty 100

## 2021-02-19 NOTE — Consult Note (Signed)
Linda Clark , MD [address], [place], [place], [place], [address] [id number] [place], [place], [place], [address] Phone: [phone]  Fax: [phone]  Consultation  Referring Provider:    Dr Kurtis Bushman Primary Care Physician:  Alene Mires Elyse Jarvis, MD Primary Gastroenterologist: None          Reason for Consultation:     Acute pancreatitis  Date of Admission:  02/18/2021 Date of Consultation:  02/19/2021         HPI:   Linda Clark is a 60 y.o. female presented to the emergency room last evening with abdominal pain and vomiting that began the day before.  In the ER she was found to have an elevated lipase of 3400.  Elevated transaminases.  Right upper quadrant ultrasound showed sludge in the gallbladder.  MRCP showed moderate to severe pancreatitis but no choledocholithiasis.  She is presently being treated for acute pancreatitis, hypertensive urgency.   She says she developed epigastric pain radiating to the back for the past 2 days associated with vomiting , no similr episode in the past , 75% feels better today , not had a bowel movement in 4 days, denies any NSAID , alcohol , OTC meds, herbal supplements recently. No family history of gall bladder issues. She speaks only spanish and entire visit conducted via interpretor.   Past Medical History:  Diagnosis Date   Hypertension    MI (myocardial infarction) (Post)    Sinusitis chronic, frontal    Thyroid disease     Past Surgical History:  Procedure Laterality Date   APPENDECTOMY     BACK SURGERY      Prior to Admission medications   Medication Sig Start Date End Date Taking? Authorizing Provider  amLODipine (NORVASC) 10 MG tablet Take 10 mg by mouth daily. 10/19/20   [provider]  atorvastatin (LIPITOR) 40 MG tablet Take 40 mg by mouth at bedtime. 09/15/20   [provider]  carvedilol (COREG) 25 MG tablet Take 25 mg by mouth 2 (two) times daily with a meal. 11/13/20    [provider]  cetirizine (ZYRTEC) 10 MG tablet Take 10 mg by mouth daily at 12 noon.    [provider]  ezetimibe (ZETIA) 10 MG tablet Take 10 mg by mouth daily. 11/13/20   [provider]  nitroGLYCERIN (NITROSTAT) 0.4 MG SL tablet Place 0.4 mg under the tongue as directed. 09/22/20   [provider]  omeprazole (PRILOSEC) 20 MG capsule Take 20 mg by mouth 2 (two) times daily. 09/15/20   [provider]  valsartan-hydrochlorothiazide (DIOVAN-HCT) 160-25 MG tablet Take 1 tablet by mouth daily.    [provider]    Family History  Problem Relation Age of Onset   Breast cancer Maternal Aunt 31   Breast cancer Cousin 22     Social History   Tobacco Use   Smoking status: Never   Smokeless tobacco: Never  Substance Use Topics   Alcohol use: Not Currently   Drug use: Not Currently    Allergies as of 02/18/2021 - Review Complete 02/18/2021  Allergen Reaction Noted   Aspirin Anaphylaxis 09/29/2020   Aspirin-dipyridamole er Anaphylaxis 02/17/2021    Review of Systems:    All systems reviewed and negative except where noted in HPI.   Physical Exam:  Vital signs in last 24 hours: Temp:  [97.9 F (36.6 C)-98.5 F (36.9 C)] 98 F (36.7 C) (11/25 0858) Pulse Rate:  [66-98] 84 (  11/25 0858) Resp:  [15-27] 19 (11/25 0858) BP: (148-204)/(70-111) 149/87 (11/25 0858) SpO2:  [81 %-100 %] 96 % (11/25 0858) Last BM Date: 02/14/21 (pt's best estimate) General:   Pleasant, cooperative in NAD Head:  Normocephalic and atraumatic. Eyes:   No icterus.   Conjunctiva pink. PERRLA. Ears:  Normal auditory acuity. Neck:  Supple; no masses or thyroidomegaly Lungs: Respirations even and unlabored. Lungs clear to auscultation bilaterally.   No wheezes, crackles, or rhonchi.  Heart:  Regular rate and rhythm;  Without murmur, clicks, rubs or gallops Abdomen:  Soft, nondistended, nontender. Normal bowel sounds. No appreciable masses or hepatomegaly.   No rebound or guarding.  Neurologic:  Alert and oriented x3;  grossly normal neurologically. Skin:  Intact without significant lesions or rashes. Cervical Nodes:  No significant cervical adenopathy. Psych:  Alert and cooperative. Normal affect.  LAB RESULTS: Recent Labs    02/17/21 1209 02/18/21 1005 02/19/21 0416  WBC 8.9 9.6 14.0*  HGB 16.0* 16.3* 15.6*  HCT 49.6* 50.5* 48.0*  PLT 301 341 291   BMET Recent Labs    02/17/21 1209 02/18/21 1005 02/19/21 0416  NA 138 138 138  K 4.4 4.3 3.3*  CL 102 102 103  CO2 28 28 27   GLUCOSE 111* 156* 122*  BUN 17 15 18   CREATININE 0.55 0.65 0.43*  CALCIUM 9.5 9.3 8.8*   LFT Recent Labs    02/19/21 0416  PROT 7.0  ALBUMIN 3.9  AST 225*  ALT 301*  ALKPHOS 82  BILITOT 2.1*   PT/INR Recent Labs    02/17/21 1321  LABPROT 12.6  INR 0.9    STUDIES: CT ANGIO HEAD NECK W WO CM  Result Date: 02/17/2021 CLINICAL DATA:  Same-day noncontrast CT head EXAM: CT ANGIOGRAPHY HEAD AND NECK TECHNIQUE: Multidetector CT imaging of the head and neck was performed using the standard protocol during bolus administration of intravenous contrast. Multiplanar CT image reconstructions and MIPs were obtained to evaluate the vascular anatomy. Carotid stenosis measurements (when applicable) are obtained utilizing NASCET criteria, using the distal internal carotid diameter as the denominator. CONTRAST:  02/19/21 OMNIPAQUE IOHEXOL 350 MG/ML SOLN COMPARISON:  Same-day noncontrast CT head. FINDINGS: CTA NECK FINDINGS Aortic arch: The left vertebral artery arises directly from the aortic arch, a normal variant. Imaged portion shows no evidence of aneurysm or dissection. No significant stenosis of the major arch vessel origins. Right carotid system: The right common, internal, and external carotid arteries are patent, without hemodynamically significant stenosis, occlusion, dissection, or aneurysm. The right internal carotid artery has a medialized retropharyngeal  course. Left carotid system: The left common, internal, and external carotid arteries are patent, without hemodynamically significant stenosis, occlusion, dissection, or aneurysm. Vertebral arteries: The right vertebral artery is dominant with a diminutive left vertebral artery., a normal variant. The vertebral arteries are patent, without hemodynamically significant stenosis, occlusion, dissection, or aneurysm. Skeleton: There is mild degenerative change at C5-C6. There is no visible canal hematoma. There is no acute osseous abnormality or aggressive osseous lesion. Other neck: The soft tissues are unremarkable. Upper chest: The imaged lung apices are clear. Review of the MIP images confirms the above findings CTA HEAD FINDINGS Anterior circulation: The bilateral cavernous ICAs are patent with minimal plaque in the left supraclinoid segment. The bilateral MCAs are patent. The bilateral ACAs are patent. There is no aneurysm. Posterior circulation: The left V4 segment is markedly diminutive after the PICA origin, likely developmental variant. The prominent right V4 segment is patent. The basilar artery  is patent. The bilateral PCAs are patent. There is no aneurysm. Venous sinuses: Patent. Anatomic variants: None. Review of the MIP images confirms the above findings IMPRESSION: 1. No aneurysm identified. 2. Patent vasculature of the head and neck, with no hemodynamically significant stenosis, occlusion, or dissection. Electronically Signed   By: Valetta Mole M.D.   On: 02/17/2021 14:02   CT Head Wo Contrast  Result Date: 02/17/2021 CLINICAL DATA:  Headache EXAM: CT HEAD WITHOUT CONTRAST TECHNIQUE: Contiguous axial images were obtained from the base of the skull through the vertex without intravenous contrast. COMPARISON:  None. FINDINGS: Brain: Small volume subarachnoid hemorrhage seen over the left frontal and occipital lobes. No evidence of acute infarction, hydrocephalus, extra-axial collection or mass  lesion/mass effect. Vascular: No hyperdense vessel or unexpected calcification. Skull: Normal. Negative for fracture or focal lesion. Sinuses/Orbits: Mucosal thickening of the ethmoid and left maxillary sinus. No acute abnormality. Other: None. IMPRESSION: Small volume subarachnoid hemorrhage seen over the left frontal and occipital lobes. Critical Value/emergent results were called by telephone at the time of interpretation on 02/17/2021 at 12:50 pm to provider JENISE MENSHEW , who verbally acknowledged these results. Electronically Signed   By: Yetta Glassman M.D.   On: 02/17/2021 12:50   MR 3D Recon At Scanner  Result Date: 02/18/2021 CLINICAL DATA:  Right upper quadrant pain EXAM: MRI ABDOMEN WITHOUT AND WITH CONTRAST (INCLUDING MRCP) TECHNIQUE: Multiplanar multisequence MR imaging of the abdomen was performed both before and after the administration of intravenous contrast. Heavily T2-weighted images of the biliary and pancreatic ducts were obtained, and three-dimensional MRCP images were rendered by post processing. CONTRAST:  7.37mL GADAVIST GADOBUTROL 1 MMOL/ML IV SOLN COMPARISON:  CT abdomen and pelvis 02/17/2021, ultrasound abdomen 02/18/2021 FINDINGS: Study is significantly limited due to motion. Lower chest: No acute findings. Hepatobiliary: Liver is upper normal size measuring 18 cm in length. There is evidence of hepatic steatosis. Several hyperintense T2 signal hepatic cysts are identified measuring up to 2.6 cm in size. Layering hypointense material in the gallbladder likely representing sludge and possible small stones. No significant gallbladder wall thickening identified. No choledocholithiasis identified. Common bile duct is mildly dilated measuring 7 mm in diameter. No significant intrahepatic ductal dilatation visualized. Pancreas: Diffuse pancreatic edema and extensive peripancreatic edema consistent with acute pancreatitis. No pancreatic mass or ductal dilatation visualized. No  pseudocyst formation identified. No evidence of necrosis. Spleen:  Within normal limits in size and appearance. Adrenals/Urinary Tract: Adrenal glands appear normal. A few renal cortical cysts identified bilaterally measuring up to 2.1 cm on the left. No hydronephrosis or enhancing renal mass identified bilaterally. Stomach/Bowel: Grossly unremarkable. Vascular/Lymphatic:  No bulky lymphadenopathy visualized. Other:  Small volume ascites. Musculoskeletal: No suspicious bony lesions. IMPRESSION: 1. Moderate to severe acute pancreatitis. 2. Dependent material in the gallbladder likely representing sludge and possibly small stones. No choledocholithiasis visualized. Common bile duct is mildly dilated. 3. Hepatic steatosis. 4. Hepatic and renal cysts. 5. Study is limited due to motion. Electronically Signed   By: Ofilia Neas M.D.   On: 02/18/2021 15:03   MR ABDOMEN MRCP W WO CONTAST  Result Date: 02/18/2021 CLINICAL DATA:  Right upper quadrant pain EXAM: MRI ABDOMEN WITHOUT AND WITH CONTRAST (INCLUDING MRCP) TECHNIQUE: Multiplanar multisequence MR imaging of the abdomen was performed both before and after the administration of intravenous contrast. Heavily T2-weighted images of the biliary and pancreatic ducts were obtained, and three-dimensional MRCP images were rendered by post processing. CONTRAST:  7.73mL GADAVIST GADOBUTROL 1 MMOL/ML IV SOLN  COMPARISON:  CT abdomen and pelvis 02/17/2021, ultrasound abdomen 02/18/2021 FINDINGS: Study is significantly limited due to motion. Lower chest: No acute findings. Hepatobiliary: Liver is upper normal size measuring 18 cm in length. There is evidence of hepatic steatosis. Several hyperintense T2 signal hepatic cysts are identified measuring up to 2.6 cm in size. Layering hypointense material in the gallbladder likely representing sludge and possible small stones. No significant gallbladder wall thickening identified. No choledocholithiasis identified. Common bile  duct is mildly dilated measuring 7 mm in diameter. No significant intrahepatic ductal dilatation visualized. Pancreas: Diffuse pancreatic edema and extensive peripancreatic edema consistent with acute pancreatitis. No pancreatic mass or ductal dilatation visualized. No pseudocyst formation identified. No evidence of necrosis. Spleen:  Within normal limits in size and appearance. Adrenals/Urinary Tract: Adrenal glands appear normal. A few renal cortical cysts identified bilaterally measuring up to 2.1 cm on the left. No hydronephrosis or enhancing renal mass identified bilaterally. Stomach/Bowel: Grossly unremarkable. Vascular/Lymphatic:  No bulky lymphadenopathy visualized. Other:  Small volume ascites. Musculoskeletal: No suspicious bony lesions. IMPRESSION: 1. Moderate to severe acute pancreatitis. 2. Dependent material in the gallbladder likely representing sludge and possibly small stones. No choledocholithiasis visualized. Common bile duct is mildly dilated. 3. Hepatic steatosis. 4. Hepatic and renal cysts. 5. Study is limited due to motion. Electronically Signed   By: Ofilia Neas M.D.   On: 02/18/2021 15:03   CT Angio Chest/Abd/Pel for Dissection W and/or Wo Contrast  Result Date: 02/17/2021 CLINICAL DATA:  Hypertension. Small subarachnoid hemorrhage on head CT. EXAM: CT ANGIOGRAPHY CHEST, ABDOMEN AND PELVIS TECHNIQUE: Non-contrast CT of the chest was initially obtained. Multidetector CT imaging through the chest, abdomen and pelvis was performed using the standard protocol during bolus administration of intravenous contrast. Multiplanar reconstructed images and MIPs were obtained and reviewed to evaluate the vascular anatomy. CONTRAST:  148mL OMNIPAQUE IOHEXOL 350 MG/ML SOLN COMPARISON:  None. FINDINGS: CTA CHEST FINDINGS Cardiovascular: Preferential opacification of the thoracic aorta. No evidence of thoracic aortic aneurysm or dissection. Normal heart size. No pericardial effusion. No central  pulmonary embolism. Mediastinum/Nodes: No enlarged mediastinal, hilar, or axillary lymph nodes. Thyroid gland, trachea, and esophagus demonstrate no significant findings. Lungs/Pleura: Lungs are clear. No pleural effusion or pneumothorax. Musculoskeletal: No chest wall abnormality. No acute or significant osseous findings. Review of the MIP images confirms the above findings. CTA ABDOMEN AND PELVIS FINDINGS VASCULAR Aorta: Normal caliber aorta without aneurysm, dissection, vasculitis or significant stenosis. Mild atherosclerotic calcification. Celiac: Patent without evidence of aneurysm, dissection, vasculitis or significant stenosis. SMA: Patent without evidence of aneurysm, dissection, vasculitis or significant stenosis. Renals: Both renal arteries are patent without evidence of aneurysm, dissection, vasculitis, fibromuscular dysplasia or significant stenosis. IMA: Patent without evidence of aneurysm, dissection, vasculitis or significant stenosis. Inflow: Patent without evidence of aneurysm, dissection, vasculitis or significant stenosis. Veins: No obvious venous abnormality within the limitations of this arterial phase study. Review of the MIP images confirms the above findings. NON-VASCULAR Hepatobiliary: Scattered hepatic simple cysts measuring up to 2.6 cm. Other subcentimeter low-density lesions within the liver are too small to characterize. Gallbladder sludge. No gallbladder wall thickening or biliary dilatation. Pancreas: Unremarkable. No pancreatic ductal dilatation or surrounding inflammatory changes. Spleen: Normal in size without focal abnormality. Adrenals/Urinary Tract: Adrenal glands are unremarkable. Bilateral renal simple cysts measuring up to 2.2 cm. No renal calculi or hydronephrosis. The bladder is unremarkable. Stomach/Bowel: Small hiatal hernia. The stomach is otherwise within normal limits. No bowel wall thickening, distention, or surrounding inflammatory changes. Lymphatic: No enlarged  abdominal or pelvic lymph nodes. Reproductive: Uterus and bilateral adnexa are unremarkable. Other: No abdominal wall hernia or abnormality. No abdominopelvic ascites. No pneumoperitoneum. Musculoskeletal: No acute or significant osseous findings. Review of the MIP images confirms the above findings. IMPRESSION: 1. No evidence of acute aortic syndrome. No aneurysm. 2. Gallbladder sludge. 3.  Aortic atherosclerosis (ICD10-I70.0). Electronically Signed   By: Titus Dubin M.D.   On: 02/17/2021 14:03   US Abdomen Limited RUQ (LIVER/GB)  Result Date: 02/18/2021 CLINICAL DATA:  Upper abdominal pain, nausea and vomiting EXAM: ULTRASOUND ABDOMEN LIMITED RIGHT UPPER QUADRANT COMPARISON:  CT 02/17/2021 FINDINGS: Gallbladder: Sludge is present within the gallbladder lumen. No shadowing gallstones or wall thickening visualized. No sonographic Murphy sign noted by sonographer. Common bile duct: Diameter: 4 mm. Liver: Multiple hepatic cysts within the liver, largest present within the left hepatic lobe measuring up to 2.5 cm. No focal solid liver lesion identified. Diffusely increased hepatic parenchymal echogenicity. Portal vein is patent on color Doppler imaging with normal direction of blood flow towards the liver. Other: None. IMPRESSION: 1. Gallbladder sludge.  No sonographic evidence of cholecystitis. 2. The echogenicity of the liver is increased. This is a nonspecific finding but is most commonly seen with fatty infiltration of the liver. There are no obvious solid liver lesions. 3. Multiple hepatic cysts. Electronically Signed   By: Davina Poke D.O.   On: 02/18/2021 12:35      Impression / Plan:   Nanea Craige Del [address] Inis Sizer is a 60 y.o. y/o femaleb has been admitted with moderate to severe acute pancreatitis likely secondary to passage of a gallstone.  No evidence of choledocholithiasis on MRCP.  Triglycerides normal 100  Plan 1.  Recommend IV fluids and fluid of choice would be Ringer  lactate.  Continue to rehydrate based on urine output and clinical status.  IV fluids are the most important aspect of treatment in moderate to severe pancreatitis and insufficient fluids can worsen outcomes.  Continue analgesia. 2.  Commence on oral intake-at the patient Is a Patient Does Not Mind Trying Orally.  Early Oral Intake Is Associated with Better Outcomes and Pancreatitis. 3.  She Would Require a Cholecystectomy.  Recommend Discussing with Surgery for Timing. 4. Fleets enema for constipation   Thank you for involving me in the care of this patient.      LOS: 1 day   Linda Bellows, MD  02/19/2021, 10:56 AM

## 2021-02-19 NOTE — Progress Notes (Signed)
Initial Nutrition Assessment  DOCUMENTATION CODES:   Obesity unspecified  INTERVENTION:   -Boost Breeze po TID, each supplement provides 250 kcal and 9 grams of protein  -30 ml Prosource Plus TID, each supplement provides 100 kcals and 15 grams protein -MVI with minerals daily -RD will follow for diet advancement and adjust supplement regimen as appropriate  NUTRITION DIAGNOSIS:   Increased nutrient needs related to acute illness (pancreatitis) as evidenced by estimated needs.  GOAL:   Patient will meet greater than or equal to 90% of their needs  MONITOR:   PO intake, Supplement acceptance, Diet advancement, Labs, Weight trends, Skin, I & O's  REASON FOR ASSESSMENT:   Malnutrition Screening Tool    ASSESSMENT:   Linda Clark Linda Clark is a 60 y.o. female with medical history significant for hypertension, hyperlipidemia, CAD and GERD who presents to the emergency department due to abdominal pain and nonbloody vomiting which started yesterday.  Pain was sharp, constant and epigastric with radiation towards middle of her back, it was associated with nausea and several episodes of nonbloody vomiting, no alleviating/aggravating factor known.  She presented to the ED yesterday due to elevated blood pressure and headache and there was concern for subarachnoid hemorrhage, but this was ruled due to negative LP and reassuring CT of head and neck chest and abdomen per ED physician and ED medical record.  Patient was then discharged home, unfortunately, she was unable to tolerate antihypertensive medication due to recurrent vomiting.  She complains of some headache which has improved since arrival to the ED, but denies fever, chills, chest pain, shortness of breath, alcohol use.  Pt admitted with acute pancreatitis.   Reviewed I/O's: +1.6 L x 24 hours  Pt resting quietly and receiving nursing care at time of visit.   Per GI notes, pt may require cholecystectomy.    Pt  currently on clear liquid diet. No meal completion data available to assess at this time.   Reviewed wt hx; pt has been stable over the past year.   Pt with increased nutritional needs for acute illness and would benefit from addition of oral nutrition supplements.   Medications reviewed and include lactated ringers infusion @ 125 ml/hr.   Labs reviewed: K: 3.3.    NUTRITION - FOCUSED PHYSICAL EXAM:  Flowsheet Row Most Recent Value  Orbital Region No depletion  Upper Arm Region No depletion  Thoracic and Lumbar Region No depletion  Buccal Region No depletion  Temple Region No depletion  Clavicle Bone Region No depletion  Clavicle and Acromion Bone Region No depletion  Scapular Bone Region No depletion  Dorsal Hand No depletion  Patellar Region No depletion  Anterior Thigh Region No depletion  Posterior Calf Region No depletion  Edema (RD Assessment) Mild  Hair Reviewed  Eyes Reviewed  Mouth Reviewed  Skin Reviewed  Nails Reviewed       Diet Order:   Diet Order             Diet NPO time specified  Diet effective midnight           Diet clear liquid Room service appropriate? Yes; Fluid consistency: Thin  Diet effective now                   EDUCATION NEEDS:   No education needs have been identified at this time  Skin:  Skin Assessment: Reviewed RN Assessment  Last BM:  02/14/21  Height:   Ht Readings from Last 1 Encounters:  02/18/21 5\' 2"  (1.575 m)    Weight:   Wt Readings from Last 1 Encounters:  02/18/21 77.7 kg    Ideal Body Weight:  50 kg  BMI:  Body mass index is 31.33 kg/m.  Estimated Nutritional Needs:   Kcal:  1550-1750  Protein:  85-100 grams  Fluid:  > 1.5 L    02/20/21, RD, LDN, CDCES Registered Dietitian II Certified Diabetes Care and Education Specialist Please refer to [facility] for RD and/or RD on-call/weekend/after hours pager

## 2021-02-19 NOTE — Consult Note (Signed)
Patient ID: Linda Clark [address], female   DOB: [birth year], 60 y.o.   MRN: [id number]  HPI Linda Clark is a 60 y.o. female went to emergency room yesterday complaining of abdominal pain nausea and vomiting.  The pain was severe located in the epigastric area and the right upper quadrant.  No fevers no chills no evidence of biliary obstruction.  No specific alleviating or aggravating factor.  Couple days ago she came to the emergency room with severe headache and hypertensive crisis that was appropriately managed in the ER.  Initially thought to have a subarachnoid hemorrhage but CTA did not confirm that.  Case was discussed with neurosurgery by the ER physician. Of note she apparently has a history of atypical chest pain.  5 months ago she was seen at [facility] cardiology for atypical chest pain.  This prompted a stress test that was negative.  More recently seen by cardiology who thinks that this chest pain was atypical and not show evidence of acute coronary syndrome. She is able to perform more than 4 METS of activity without any shortness of breath or chest pain.  She recalls that over the daily although she did have issues with her gallbladder. She is from [place] and speaks Spanish with little [place]. Did have a CT scan of the abdomen pelvis as well as an ultrasound that have personally reviewed as well as an MRCP showing evidence of pancreatitis with normal common bile duct and is large in the gallbladder with ? gallstones. No evidence of choledocholithiasis. She had a prior appendectomy and back surgery Patient lipase yesterday was 3473 and today is 978.  CMP today showed mild elevation of the AST and ALT and a total bilirubin of 2.1.  This is going in the right direction.  Creatinine is normal.  BC shows a white count of 14,000 hemoglobin of 15.6  HPI  Past Medical History:  Diagnosis Date   Hypertension    MI (myocardial infarction) (HCC)    Sinusitis  chronic, frontal    Thyroid disease     Past Surgical History:  Procedure Laterality Date   APPENDECTOMY     BACK SURGERY      Family History  Problem Relation Age of Onset   Breast cancer Maternal Aunt 23   Breast cancer Cousin 54    Social History Social History   Tobacco Use   Smoking status: Never   Smokeless tobacco: Never  Substance Use Topics   Alcohol use: Not Currently   Drug use: Not Currently    Allergies  Allergen Reactions   Aspirin Anaphylaxis   Aspirin-Dipyridamole Er Anaphylaxis    Current Facility-Administered Medications  Medication Dose Route Frequency Provider Last Rate Last Admin   (feeding supplement) PROSource Plus liquid 30 mL  30 mL Oral TID BM Lynn Ito, MD   30 mL at 02/19/21 1537   cefTRIAXone (ROCEPHIN) 2 g in sodium chloride 0.9 % 100 mL IVPB  2 g Intravenous Q24H Kaelynn Igo F, MD       enoxaparin (LOVENOX) injection 40 mg  40 mg Subcutaneous Q24H Adefeso, Oladapo, DO   40 mg at 02/19/21 1754   feeding supplement (BOOST / RESOURCE BREEZE) liquid 1 Container  1 Container Oral TID BM Lynn Ito, MD   1 Container at 02/19/21 1536   HYDROmorphone (DILAUDID) injection 0.5 mg  0.5 mg Intravenous Once Adefeso, Oladapo, DO       HYDROmorphone (DILAUDID) injection 0.5 mg  0.5  mg Intravenous Q4H PRN Adefeso, Oladapo, DO   0.5 mg at 02/19/21 1537   labetalol (NORMODYNE) injection 5 mg  5 mg Intravenous Q4H Lynn Ito, MD   5 mg at 02/19/21 1316   lactated ringers infusion   Intravenous Continuous Adefeso, Oladapo, DO 125 mL/hr at 02/19/21 0004 New Bag at 02/19/21 0004   multivitamin with minerals tablet 1 tablet  1 tablet Oral Daily Lynn Ito, MD   1 tablet at 02/19/21 1537   ondansetron (ZOFRAN) injection 4 mg  4 mg Intravenous Q6H PRN Adefeso, Oladapo, DO   4 mg at 02/19/21 0538   pantoprazole (PROTONIX) injection 40 mg  40 mg Intravenous Q12H Adefeso, Oladapo, DO   40 mg at 02/19/21 4782     Review of Systems Full ROS  was asked and  was negative except for the information on the HPI  Physical Exam Blood pressure 111/64, pulse 89, temperature 98 F (36.7 C), temperature source Oral, resp. rate 20, height 5\' 2"  (1.575 m), weight 77.7 kg, SpO2 94 %. CONSTITUTIONAL: NAD. EYES: Pupils are equal, round, , Sclera are non-icteric. EARS, NOSE, MOUTH AND THROAT:She is wearing a mask. Hearing is intact to voice. LYMPH NODES:  Lymph nodes in the neck are normal. RESPIRATORY:  Lungs are clear. There is normal respiratory effort, with equal breath sounds bilaterally, and without pathologic use of accessory muscles. CARDIOVASCULAR: Heart is regular without murmurs, gallops, or rubs. GI: The abdomen is  soft, mild tenderness epigastric area, no peritonitis   There are no palpable masses. There is no hepatosplenomegaly. There are normal bowel sounds  GU: Rectal deferred.   MUSCULOSKELETAL: Normal muscle strength and tone. No cyanosis or edema.   SKIN: Turgor is good and there are no pathologic skin lesions or ulcers. NEUROLOGIC: Motor and sensation is grossly normal. Cranial nerves are grossly intact. PSYCH:  Oriented to person, place and time. Affect is normal.  Data Reviewed  I have personally reviewed the patient's imaging, laboratory findings and medical records.    Assessment/Plan 60 year old female with gallstone pancreatitis.  Discussed with the patient in detail about her disease process.  Currently her symptoms have improved some.  We will continue to assess.  Cussed with her in detail about the need of cholecystectomy during this hospitalization.  We will tentatively add her to the schedule for tomorrow she understands if her pain were to worsen or she were to deteriorate we may have to postpone the surgery.I discussed the procedure in detail.  The patient was given 41.  We discussed the risks and benefits of a laparoscopic cholecystectomy and possible cholangiogram including, but not limited to bleeding,  infection, injury to surrounding structures such as the intestine or liver, bile leak, retained gallstones, need to convert to an open procedure, prolonged diarrhea, blood clots such as  DVT, common bile duct injury, anesthesia risks, and possible need for additional procedures.  The likelihood of improvement in symptoms and return to the patient's normal status is good. We discussed the typical post-operative recovery course.       Agricultural engineer, MD FACS General Surgeon 02/19/2021, 6:05 PM

## 2021-02-19 NOTE — Progress Notes (Signed)
Informed Consent form signed and placed in patient's chart. Interpreter used for head-to-toe assessment. All questions and concerns answered and addressed at this time. Family at bedside also aware of plan of care.

## 2021-02-19 NOTE — Progress Notes (Addendum)
PROGRESS NOTE    Linda Clark  [id number] DOB: [birth year] DOA: 02/18/2021 PCP: Theotis Burrow, MD    Brief Narrative:  Linda Clark is a 60 y.o. female with medical history significant for hypertension, hyperlipidemia, CAD and GERD who presents to the emergency department due to abdominal pain and nonbloody vomiting .    Consultants:  GI  Procedures:   Antimicrobials:      Subjective: Still with abd pain needing pain meds. Some nausea, no vomiting  Objective: Vitals:   02/18/21 2225 02/18/21 2313 02/19/21 0000 02/19/21 0438  BP: (!) 159/88 (!) 148/70 (!) 165/106 (!) 156/92  Pulse: 78 68 77 81  Resp: 19 15 16 18   Temp: 97.9 F (36.6 C)  98.1 F (36.7 C) 98.5 F (36.9 C)  TempSrc: Oral  Oral Oral  SpO2: 100% 100% 98% 96%  Weight:      Height:        Intake/Output Summary (Last 24 hours) at 02/19/2021 0844 Last data filed at 02/19/2021 0446 Gross per 24 hour  Intake 1576.85 ml  Output --  Net 1576.85 ml   Filed Weights   02/18/21 0953  Weight: 77.7 kg    Examination:  General exam: Appears calm and comfortable  Respiratory system: Clear to auscultation. Respiratory effort normal. Cardiovascular system: S1 & S2 heard, RRR. No JVD, murmurs, rubs, gallops or clicks. Gastrointestinal system: epigastric ttp.+bs Soft   Central nervous system: Alert and oriented. No focal neurological deficits. Extremities: no edema Psychiatry: Judgement and insight appear normal. Mood & affect appropriate.     Data Reviewed: I have personally reviewed following labs and imaging studies  CBC: Recent Labs  Lab 02/17/21 1209 02/18/21 1005 02/19/21 0416  WBC 8.9 9.6 14.0*  NEUTROABS 6.4  --   --   HGB 16.0* 16.3* 15.6*  HCT 49.6* 50.5* 48.0*  MCV 92.5 93.5 92.8  PLT 301 341 [id number]   Basic Metabolic Panel: Recent Labs  Lab 02/17/21 1209 02/18/21 1005 02/19/21 0416  NA 138 138 138  K 4.4 4.3 3.3*  CL  102 102 103  CO2 28 28 27   GLUCOSE 111* 156* 122*  BUN 17 15 18   CREATININE 0.55 0.65 0.43*  CALCIUM 9.5 9.3 8.8*  MG  --   --  2.4  PHOS  --   --  3.6   GFR: Estimated Creatinine Clearance: 73.9 mL/min (A) (by C-G formula based on SCr of 0.43 mg/dL (L)). Liver Function Tests: Recent Labs  Lab 02/17/21 1209 02/18/21 1005 02/19/21 0416  AST 54* 367* 225*  ALT 35 296* 301*  ALKPHOS 65 85 82  BILITOT 0.9 3.4* 2.1*  PROT 8.0 8.3* 7.0  ALBUMIN 4.5 4.6 3.9   Recent Labs  Lab 02/18/21 1005  LIPASE 3,473*   No results for input(s): AMMONIA in the last 168 hours. Coagulation Profile: Recent Labs  Lab 02/17/21 1321  INR 0.9   Cardiac Enzymes: No results for input(s): CKTOTAL, CKMB, CKMBINDEX, TROPONINI in the last 168 hours. BNP (last 3 results) No results for input(s): PROBNP in the last 8760 hours. HbA1C: No results for input(s): HGBA1C in the last 72 hours. CBG: No results for input(s): GLUCAP in the last 168 hours. Lipid Profile: Recent Labs    02/19/21 0416  TRIG 100   Thyroid Function Tests: No results for input(s): TSH, T4TOTAL, FREET4, T3FREE, THYROIDAB in the last 72 hours. Anemia Panel: No results for input(s): VITAMINB12, FOLATE, FERRITIN, TIBC, IRON, RETICCTPCT  in the last 72 hours. Sepsis Labs: No results for input(s): PROCALCITON, LATICACIDVEN in the last 168 hours.  Recent Results (from the past 240 hour(s))  Resp Panel by RT-PCR (Flu A&B, Covid) Nasopharyngeal Swab     Status: None   Collection Time: 02/17/21  1:21 PM   Specimen: Nasopharyngeal Swab; Nasopharyngeal(NP) swabs in vial transport medium  Result Value Ref Range Status   SARS Coronavirus 2 by RT PCR NEGATIVE NEGATIVE Final    Comment: (NOTE) SARS-CoV-2 target nucleic acids are NOT DETECTED.  The SARS-CoV-2 RNA is generally detectable in upper respiratory specimens during the acute phase of infection. The lowest concentration of SARS-CoV-2 viral copies this assay can detect is 138  copies/mL. A negative result does not preclude SARS-Cov-2 infection and should not be used as the sole basis for treatment or other patient management decisions. A negative result may occur with  improper specimen collection/handling, submission of specimen other than nasopharyngeal swab, presence of viral mutation(s) within the areas targeted by this assay, and inadequate number of viral copies(<138 copies/mL). A negative result must be combined with clinical observations, patient history, and epidemiological information. The expected result is Negative.  Fact Sheet for Patients:  EntrepreneurPulse.com.au  Fact Sheet for Healthcare Providers:  IncredibleEmployment.be  This test is no t yet approved or cleared by the [place] FDA and  has been authorized for detection and/or diagnosis of SARS-CoV-2 by FDA under an Emergency Use Authorization (EUA). This EUA will remain  in effect (meaning this test can be used) for the duration of the COVID-19 declaration under Section 564(b)(1) of the Act, 21 U.S.C.section 360bbb-3(b)(1), unless the authorization is terminated  or revoked sooner.       Influenza A by PCR NEGATIVE NEGATIVE Final   Influenza B by PCR NEGATIVE NEGATIVE Final    Comment: (NOTE) The Xpert Xpress SARS-CoV-2/FLU/RSV plus assay is intended as an aid in the diagnosis of influenza from Nasopharyngeal swab specimens and should not be used as a sole basis for treatment. Nasal washings and aspirates are unacceptable for Xpert Xpress SARS-CoV-2/FLU/RSV testing.  Fact Sheet for Patients: EntrepreneurPulse.com.au  Fact Sheet for Healthcare Providers: IncredibleEmployment.be  This test is not yet approved or cleared by the [place] FDA and has been authorized for detection and/or diagnosis of SARS-CoV-2 by FDA under an Emergency Use Authorization (EUA). This EUA will remain in effect (meaning  this test can be used) for the duration of the COVID-19 declaration under Section 564(b)(1) of the Act, 21 U.S.C. section 360bbb-3(b)(1), unless the authorization is terminated or revoked.  Performed at [facility], [address] [place], [place] [address]   CSF culture w Gram Stain     Status: None (Preliminary result)   Collection Time: 02/17/21  3:11 PM   Specimen: CSF; Cerebrospinal Fluid  Result Value Ref Range Status   Specimen Description   Final    CSF Performed at [facility], [address] [place], [place] [address]    Special Requests   Final    NONE Performed at [facility], [address] [place], [place] [address]    Gram Stain   Final    CYTOSPIN SLIDE WBC SEEN NO RBC SEEN NO ORGANISMS SEEN Performed at [facility], [address] [place], [place] [address]    Culture   Final    NO GROWTH 2 DAYS Performed at [facility], [place] [address], [place], [place] [address]    Report Status PENDING  Incomplete  Resp Panel by RT-PCR (Flu A&B, Covid) Nasopharyngeal Swab     Status: None   Collection Time: 02/18/21 12:10 PM   Specimen: Nasopharyngeal Swab; Nasopharyngeal(NP) swabs in vial transport medium  Result Value Ref Range Status   SARS Coronavirus 2 by RT PCR NEGATIVE NEGATIVE Final    Comment: (NOTE) SARS-CoV-2 target nucleic acids are NOT DETECTED.  The SARS-CoV-2 RNA is generally detectable in upper respiratory specimens during the acute phase of infection. The lowest concentration of SARS-CoV-2 viral copies this assay can detect is 138 copies/mL. A negative result does not preclude SARS-Cov-2 infection and should not be used as the sole basis for treatment or other patient management decisions. A negative result may occur with  improper specimen collection/handling, submission of specimen other than nasopharyngeal swab, presence of viral mutation(s) within the areas targeted by this assay, and  inadequate number of viral copies(<138 copies/mL). A negative result must be combined with clinical observations, patient history, and epidemiological information. The expected result is Negative.  Fact Sheet for Patients:  BloggerCourse.com  Fact Sheet for Healthcare Providers:  SeriousBroker.it  This test is no t yet approved or cleared by the [place] FDA and  has been authorized for detection and/or diagnosis of SARS-CoV-2 by FDA under an Emergency Use Authorization (EUA). This EUA will remain  in effect (meaning this test can be used) for the duration of the COVID-19 declaration under Section 564(b)(1) of the Act, 21 U.S.C.section 360bbb-3(b)(1), unless the authorization is terminated  or revoked sooner.       Influenza A by PCR NEGATIVE NEGATIVE Final   Influenza B by PCR NEGATIVE NEGATIVE Final    Comment: (NOTE) The Xpert Xpress SARS-CoV-2/FLU/RSV plus assay is intended as an aid in the diagnosis of influenza from Nasopharyngeal swab specimens and should not be used as a sole basis for treatment. Nasal washings and aspirates are unacceptable for Xpert Xpress SARS-CoV-2/FLU/RSV testing.  Fact Sheet for Patients: BloggerCourse.com  Fact Sheet for Healthcare Providers: SeriousBroker.it  This test is not yet approved or cleared by the [place] FDA and has been authorized for detection and/or diagnosis of SARS-CoV-2 by FDA under an Emergency Use Authorization (EUA). This EUA will remain in effect (meaning this test can be used) for the duration of the COVID-19 declaration under Section 564(b)(1) of the Act, 21 U.S.C. section 360bbb-3(b)(1), unless the authorization is terminated or revoked.  Performed at [facility], [address] [place], [place] [address]          Radiology Studies: CT ANGIO HEAD NECK W WO CM  Result Date:  02/17/2021 CLINICAL DATA:  Same-day noncontrast CT head EXAM: CT ANGIOGRAPHY HEAD AND NECK TECHNIQUE: Multidetector CT imaging of the head and neck was performed using the standard protocol during bolus administration of intravenous contrast. Multiplanar CT image reconstructions and MIPs were obtained to evaluate the vascular anatomy. Carotid stenosis measurements (when applicable) are obtained utilizing NASCET criteria, using the distal internal carotid diameter as the denominator. CONTRAST:  OMNIPAQUE IOHEXOL 350 MG/ML SOLN COMPARISON:  Same-day noncontrast CT head. FINDINGS: CTA NECK FINDINGS Aortic arch: The left vertebral artery arises directly from the aortic arch, a normal variant. Imaged portion shows no evidence of aneurysm or dissection. No significant stenosis of the major arch vessel origins. Right carotid system: The right common, internal, and external carotid arteries are patent, without hemodynamically significant stenosis, occlusion, dissection, or aneurysm. The right internal carotid artery has a medialized retropharyngeal course. Left carotid system: The left  common, internal, and external carotid arteries are patent, without hemodynamically significant stenosis, occlusion, dissection, or aneurysm. Vertebral arteries: The right vertebral artery is dominant with a diminutive left vertebral artery., a normal variant. The vertebral arteries are patent, without hemodynamically significant stenosis, occlusion, dissection, or aneurysm. Skeleton: There is mild degenerative change at C5-C6. There is no visible canal hematoma. There is no acute osseous abnormality or aggressive osseous lesion. Other neck: The soft tissues are unremarkable. Upper chest: The imaged lung apices are clear. Review of the MIP images confirms the above findings CTA HEAD FINDINGS Anterior circulation: The bilateral cavernous ICAs are patent with minimal plaque in the left supraclinoid segment. The bilateral MCAs are patent.  The bilateral ACAs are patent. There is no aneurysm. Posterior circulation: The left V4 segment is markedly diminutive after the PICA origin, likely developmental variant. The prominent right V4 segment is patent. The basilar artery is patent. The bilateral PCAs are patent. There is no aneurysm. Venous sinuses: Patent. Anatomic variants: None. Review of the MIP images confirms the above findings IMPRESSION: 1. No aneurysm identified. 2. Patent vasculature of the head and neck, with no hemodynamically significant stenosis, occlusion, or dissection. Electronically Signed   By: Valetta Mole M.D.   On: 02/17/2021 14:02   CT Head Wo Contrast  Result Date: 02/17/2021 CLINICAL DATA:  Headache EXAM: CT HEAD WITHOUT CONTRAST TECHNIQUE: Contiguous axial images were obtained from the base of the skull through the vertex without intravenous contrast. COMPARISON:  None. FINDINGS: Brain: Small volume subarachnoid hemorrhage seen over the left frontal and occipital lobes. No evidence of acute infarction, hydrocephalus, extra-axial collection or mass lesion/mass effect. Vascular: No hyperdense vessel or unexpected calcification. Skull: Normal. Negative for fracture or focal lesion. Sinuses/Orbits: Mucosal thickening of the ethmoid and left maxillary sinus. No acute abnormality. Other: None. IMPRESSION: Small volume subarachnoid hemorrhage seen over the left frontal and occipital lobes. Critical Value/emergent results were called by telephone at the time of interpretation on 02/17/2021 at 12:50 pm to provider JENISE MENSHEW , who verbally acknowledged these results. Electronically Signed   By: Yetta Glassman M.D.   On: 02/17/2021 12:50   MR 3D Recon At Scanner  Result Date: 02/18/2021 CLINICAL DATA:  Right upper quadrant pain EXAM: MRI ABDOMEN WITHOUT AND WITH CONTRAST (INCLUDING MRCP) TECHNIQUE: Multiplanar multisequence MR imaging of the abdomen was performed both before and after the administration of intravenous  contrast. Heavily T2-weighted images of the biliary and pancreatic ducts were obtained, and three-dimensional MRCP images were rendered by post processing. CONTRAST:  7.52mL GADAVIST GADOBUTROL 1 MMOL/ML IV SOLN COMPARISON:  CT abdomen and pelvis 02/17/2021, ultrasound abdomen 02/18/2021 FINDINGS: Study is significantly limited due to motion. Lower chest: No acute findings. Hepatobiliary: Liver is upper normal size measuring 18 cm in length. There is evidence of hepatic steatosis. Several hyperintense T2 signal hepatic cysts are identified measuring up to 2.6 cm in size. Layering hypointense material in the gallbladder likely representing sludge and possible small stones. No significant gallbladder wall thickening identified. No choledocholithiasis identified. Common bile duct is mildly dilated measuring 7 mm in diameter. No significant intrahepatic ductal dilatation visualized. Pancreas: Diffuse pancreatic edema and extensive peripancreatic edema consistent with acute pancreatitis. No pancreatic mass or ductal dilatation visualized. No pseudocyst formation identified. No evidence of necrosis. Spleen:  Within normal limits in size and appearance. Adrenals/Urinary Tract: Adrenal glands appear normal. A few renal cortical cysts identified bilaterally measuring up to 2.1 cm on the left. No hydronephrosis or enhancing renal mass identified bilaterally.  Stomach/Bowel: Grossly unremarkable. Vascular/Lymphatic:  No bulky lymphadenopathy visualized. Other:  Small volume ascites. Musculoskeletal: No suspicious bony lesions. IMPRESSION: 1. Moderate to severe acute pancreatitis. 2. Dependent material in the gallbladder likely representing sludge and possibly small stones. No choledocholithiasis visualized. Common bile duct is mildly dilated. 3. Hepatic steatosis. 4. Hepatic and renal cysts. 5. Study is limited due to motion. Electronically Signed   By: Ofilia Neas M.D.   On: 02/18/2021 15:03   MR ABDOMEN MRCP W WO  CONTAST  Result Date: 02/18/2021 CLINICAL DATA:  Right upper quadrant pain EXAM: MRI ABDOMEN WITHOUT AND WITH CONTRAST (INCLUDING MRCP) TECHNIQUE: Multiplanar multisequence MR imaging of the abdomen was performed both before and after the administration of intravenous contrast. Heavily T2-weighted images of the biliary and pancreatic ducts were obtained, and three-dimensional MRCP images were rendered by post processing. CONTRAST:  7.37mL GADAVIST GADOBUTROL 1 MMOL/ML IV SOLN COMPARISON:  CT abdomen and pelvis 02/17/2021, ultrasound abdomen 02/18/2021 FINDINGS: Study is significantly limited due to motion. Lower chest: No acute findings. Hepatobiliary: Liver is upper normal size measuring 18 cm in length. There is evidence of hepatic steatosis. Several hyperintense T2 signal hepatic cysts are identified measuring up to 2.6 cm in size. Layering hypointense material in the gallbladder likely representing sludge and possible small stones. No significant gallbladder wall thickening identified. No choledocholithiasis identified. Common bile duct is mildly dilated measuring 7 mm in diameter. No significant intrahepatic ductal dilatation visualized. Pancreas: Diffuse pancreatic edema and extensive peripancreatic edema consistent with acute pancreatitis. No pancreatic mass or ductal dilatation visualized. No pseudocyst formation identified. No evidence of necrosis. Spleen:  Within normal limits in size and appearance. Adrenals/Urinary Tract: Adrenal glands appear normal. A few renal cortical cysts identified bilaterally measuring up to 2.1 cm on the left. No hydronephrosis or enhancing renal mass identified bilaterally. Stomach/Bowel: Grossly unremarkable. Vascular/Lymphatic:  No bulky lymphadenopathy visualized. Other:  Small volume ascites. Musculoskeletal: No suspicious bony lesions. IMPRESSION: 1. Moderate to severe acute pancreatitis. 2. Dependent material in the gallbladder likely representing sludge and possibly  small stones. No choledocholithiasis visualized. Common bile duct is mildly dilated. 3. Hepatic steatosis. 4. Hepatic and renal cysts. 5. Study is limited due to motion. Electronically Signed   By: Ofilia Neas M.D.   On: 02/18/2021 15:03   CT Angio Chest/Abd/Pel for Dissection W and/or Wo Contrast  Result Date: 02/17/2021 CLINICAL DATA:  Hypertension. Small subarachnoid hemorrhage on head CT. EXAM: CT ANGIOGRAPHY CHEST, ABDOMEN AND PELVIS TECHNIQUE: Non-contrast CT of the chest was initially obtained. Multidetector CT imaging through the chest, abdomen and pelvis was performed using the standard protocol during bolus administration of intravenous contrast. Multiplanar reconstructed images and MIPs were obtained and reviewed to evaluate the vascular anatomy. CONTRAST:  172mL OMNIPAQUE IOHEXOL 350 MG/ML SOLN COMPARISON:  None. FINDINGS: CTA CHEST FINDINGS Cardiovascular: Preferential opacification of the thoracic aorta. No evidence of thoracic aortic aneurysm or dissection. Normal heart size. No pericardial effusion. No central pulmonary embolism. Mediastinum/Nodes: No enlarged mediastinal, hilar, or axillary lymph nodes. Thyroid gland, trachea, and esophagus demonstrate no significant findings. Lungs/Pleura: Lungs are clear. No pleural effusion or pneumothorax. Musculoskeletal: No chest wall abnormality. No acute or significant osseous findings. Review of the MIP images confirms the above findings. CTA ABDOMEN AND PELVIS FINDINGS VASCULAR Aorta: Normal caliber aorta without aneurysm, dissection, vasculitis or significant stenosis. Mild atherosclerotic calcification. Celiac: Patent without evidence of aneurysm, dissection, vasculitis or significant stenosis. SMA: Patent without evidence of aneurysm, dissection, vasculitis or significant stenosis. Renals: Both renal arteries  are patent without evidence of aneurysm, dissection, vasculitis, fibromuscular dysplasia or significant stenosis. IMA: Patent without  evidence of aneurysm, dissection, vasculitis or significant stenosis. Inflow: Patent without evidence of aneurysm, dissection, vasculitis or significant stenosis. Veins: No obvious venous abnormality within the limitations of this arterial phase study. Review of the MIP images confirms the above findings. NON-VASCULAR Hepatobiliary: Scattered hepatic simple cysts measuring up to 2.6 cm. Other subcentimeter low-density lesions within the liver are too small to characterize. Gallbladder sludge. No gallbladder wall thickening or biliary dilatation. Pancreas: Unremarkable. No pancreatic ductal dilatation or surrounding inflammatory changes. Spleen: Normal in size without focal abnormality. Adrenals/Urinary Tract: Adrenal glands are unremarkable. Bilateral renal simple cysts measuring up to 2.2 cm. No renal calculi or hydronephrosis. The bladder is unremarkable. Stomach/Bowel: Small hiatal hernia. The stomach is otherwise within normal limits. No bowel wall thickening, distention, or surrounding inflammatory changes. Lymphatic: No enlarged abdominal or pelvic lymph nodes. Reproductive: Uterus and bilateral adnexa are unremarkable. Other: No abdominal wall hernia or abnormality. No abdominopelvic ascites. No pneumoperitoneum. Musculoskeletal: No acute or significant osseous findings. Review of the MIP images confirms the above findings. IMPRESSION: 1. No evidence of acute aortic syndrome. No aneurysm. 2. Gallbladder sludge. 3.  Aortic atherosclerosis (ICD10-I70.0). Electronically Signed   By: Titus Dubin M.D.   On: 02/17/2021 14:03   US Abdomen Limited RUQ (LIVER/GB)  Result Date: 02/18/2021 CLINICAL DATA:  Upper abdominal pain, nausea and vomiting EXAM: ULTRASOUND ABDOMEN LIMITED RIGHT UPPER QUADRANT COMPARISON:  CT 02/17/2021 FINDINGS: Gallbladder: Sludge is present within the gallbladder lumen. No shadowing gallstones or wall thickening visualized. No sonographic Murphy sign noted by sonographer. Common bile  duct: Diameter: 4 mm. Liver: Multiple hepatic cysts within the liver, largest present within the left hepatic lobe measuring up to 2.5 cm. No focal solid liver lesion identified. Diffusely increased hepatic parenchymal echogenicity. Portal vein is patent on color Doppler imaging with normal direction of blood flow towards the liver. Other: None. IMPRESSION: 1. Gallbladder sludge.  No sonographic evidence of cholecystitis. 2. The echogenicity of the liver is increased. This is a nonspecific finding but is most commonly seen with fatty infiltration of the liver. There are no obvious solid liver lesions. 3. Multiple hepatic cysts. Electronically Signed   By: Davina Poke D.O.   On: 02/18/2021 12:35        Scheduled Meds:  enoxaparin (LOVENOX) injection  40 mg Subcutaneous Q24H    HYDROmorphone (DILAUDID) injection  0.5 mg Intravenous Once   pantoprazole (PROTONIX) IV  40 mg Intravenous Q12H   Continuous Infusions:  lactated ringers 125 mL/hr at 02/19/21 0004    Assessment & Plan:   Principal Problem:   Acute pancreatitis Active Problems:   Abdominal pain   Nausea & vomiting   Elevated lipase   Hyperglycemia   Transaminitis   Biliary sludge determined by ultrasound   Hepatic steatosis   Hypertensive urgency   Obesity (BMI 30.0-34.9)   Essential hypertension   Mixed hyperlipidemia   CAD (coronary artery disease)   Dehydration  Acute pancreatitis Likely secondary to passage of a gallstone No evidence of Choledocholithiasis on MRCP Triglyceride 200 Lipase elevated GI consulted Continue IV fluids for hydration Monitor urine output Keep n.p.o. for now Pain management Will consult general surgery in case she will require cholecystectomy     Hypertensive urgency Essential hypertension Was not able to take home p.o. meds due to above Pain control as likely also contributing Iv bp meds   Hypokalemia Mild , K 3.5 Will  replace with iv kcl  Hyperglycemia  CBG 156; A1c  was 5.7 in June 2022 (per medical record care everywhere) Currently npo. Diet controlled  CAD/mixed hyperlipidemia No aspirin noted on patient's home med rec 11/25 no statin due to elevated LFT monitor   Obesity (123456 kg/m) Complicates overall prognosis.   Advise diet and lifestyle modification and weight loss.      DVT prophylaxis: Lovenox Code Status: Full Family Communication: Husband at bedside Disposition Plan:  Status is: Inpatient  Remains inpatient appropriate because: IV treatment            LOS: 1 day   Time spent: 45 minutes with more than 50% on [place], MD [facility] Pager [phone]  If 7PM-7AM, please contact night-coverage 02/19/2021, 8:44 AM

## 2021-02-20 ENCOUNTER — Inpatient Hospital Stay: Payer: Self-pay | Admitting: Certified Registered"

## 2021-02-20 ENCOUNTER — Encounter
Admission: EM | Disposition: A | Discharge status: Home / Self Care | Payer: Self-pay | Source: Home / Self Care | Attending: Internal Medicine

## 2021-02-20 DIAGNOSIS — R9389 Abnormal findings on diagnostic imaging of other specified body structures: Secondary | ICD-10-CM

## 2021-02-20 DIAGNOSIS — K8000 Calculus of gallbladder with acute cholecystitis without obstruction: Principal | ICD-10-CM

## 2021-02-20 LAB — COMPREHENSIVE METABOLIC PANEL
ALT: 151 U/L — ABNORMAL HIGH (ref 0–44)
AST: 48 U/L — ABNORMAL HIGH (ref 15–41)
Albumin: 3.9 g/dL (ref 3.5–5.0)
Alkaline Phosphatase: 58 U/L (ref 38–126)
Anion gap: 7 (ref 5–15)
BUN: 15 mg/dL (ref 6–20)
CO2: 29 mmol/L (ref 22–32)
Calcium: 8.5 mg/dL — ABNORMAL LOW (ref 8.9–10.3)
Chloride: 102 mmol/L (ref 98–111)
Creatinine, Ser: 0.49 mg/dL (ref 0.44–1.00)
GFR, Estimated: >60 mL/min (ref >60)
Glucose, Bld: 109 mg/dL — ABNORMAL HIGH (ref 70–99)
Potassium: 3.3 mmol/L — ABNORMAL LOW (ref 3.5–5.1)
Sodium: 138 mmol/L (ref 135–145)
Total Bilirubin: 1.5 mg/dL — ABNORMAL HIGH (ref 0.3–1.2)
Total Protein: 7 g/dL (ref 6.5–8.1)

## 2021-02-20 LAB — LIPASE, BLOOD: Lipase: 144 U/L — ABNORMAL HIGH (ref 11–51)

## 2021-02-20 SURGERY — CHOLECYSTECTOMY, ROBOT-ASSISTED, LAPAROSCOPIC
Anesthesia: General | Site: Abdomen

## 2021-02-20 MED ORDER — PROPOFOL 10 MG/ML IV BOLUS
INTRAVENOUS | Status: AC
  Filled 2021-02-20: qty 20

## 2021-02-20 MED ORDER — PROPOFOL 10 MG/ML IV BOLUS
INTRAVENOUS | Status: DC | PRN
  Administered 2021-02-20: 110 mg via INTRAVENOUS

## 2021-02-20 MED ORDER — ONDANSETRON HCL 4 MG/2ML IJ SOLN: 4.0000 mg | Freq: Once | INTRAMUSCULAR | Status: DC | PRN

## 2021-02-20 MED ORDER — DEXAMETHASONE SODIUM PHOSPHATE 10 MG/ML IJ SOLN
INTRAMUSCULAR | Status: AC
  Filled 2021-02-20: qty 1

## 2021-02-20 MED ORDER — POTASSIUM CHLORIDE 10 MEQ/100ML IV SOLN
10.0000 meq | INTRAVENOUS | Status: AC
  Administered 2021-02-20 (×3): 10 meq via INTRAVENOUS
  Filled 2021-02-20: qty 100

## 2021-02-20 MED ORDER — ACETAMINOPHEN 10 MG/ML IV SOLN
INTRAVENOUS | Status: DC | PRN
  Administered 2021-02-20: 1000 mg via INTRAVENOUS

## 2021-02-20 MED ORDER — INDOCYANINE GREEN 25 MG IV SOLR
5.0000 mg | Freq: Once | INTRAVENOUS | Status: AC
  Administered 2021-02-20: 5 mg via INTRAVENOUS
  Filled 2021-02-20: qty 2

## 2021-02-20 MED ORDER — LIDOCAINE HCL (PF) 2 % IJ SOLN
INTRAMUSCULAR | Status: AC
  Filled 2021-02-20: qty 5

## 2021-02-20 MED ORDER — HYDROMORPHONE HCL 1 MG/ML PO LIQD
0.5000 mg | Freq: Once | ORAL | Status: DC
  Filled 2021-02-20: qty 1

## 2021-02-20 MED ORDER — FENTANYL CITRATE (PF) 100 MCG/2ML IJ SOLN: 25.0000 ug | INTRAMUSCULAR | Status: DC | PRN

## 2021-02-20 MED ORDER — 0.9 % SODIUM CHLORIDE (POUR BTL) OPTIME
TOPICAL | Status: DC | PRN
  Administered 2021-02-20: 1000 mL

## 2021-02-20 MED ORDER — FENTANYL CITRATE (PF) 100 MCG/2ML IJ SOLN
INTRAMUSCULAR | Status: AC
  Filled 2021-02-20: qty 2

## 2021-02-20 MED ORDER — SODIUM CHLORIDE 0.9 % IV SOLN
INTRAVENOUS | Status: AC | PRN
  Administered 2021-02-20: 1000 mL

## 2021-02-20 MED ORDER — BUPIVACAINE LIPOSOME 1.3 % IJ SUSP
INTRAMUSCULAR | Status: DC | PRN
  Administered 2021-02-20: 20 mL

## 2021-02-20 MED ORDER — PIPERACILLIN-TAZOBACTAM 3.375 G IVPB
3.3750 g | Freq: Three times a day (TID) | INTRAVENOUS | Status: DC
  Administered 2021-02-20 – 2021-02-25 (×14): 3.375 g via INTRAVENOUS
  Filled 2021-02-20 (×14): qty 50

## 2021-02-20 MED ORDER — DEXAMETHASONE SODIUM PHOSPHATE 10 MG/ML IJ SOLN
INTRAMUSCULAR | Status: DC | PRN
  Administered 2021-02-20: 10 mg via INTRAVENOUS

## 2021-02-20 MED ORDER — FENTANYL CITRATE (PF) 100 MCG/2ML IJ SOLN
INTRAMUSCULAR | Status: DC | PRN
  Administered 2021-02-20 (×4): 50 ug via INTRAVENOUS

## 2021-02-20 MED ORDER — MIDAZOLAM HCL 2 MG/2ML IJ SOLN
INTRAMUSCULAR | Status: DC | PRN
  Administered 2021-02-20: 1 mg via INTRAVENOUS

## 2021-02-20 MED ORDER — LIDOCAINE HCL (CARDIAC) PF 100 MG/5ML IV SOSY
PREFILLED_SYRINGE | INTRAVENOUS | Status: DC | PRN
  Administered 2021-02-20: 100 mg via INTRAVENOUS

## 2021-02-20 MED ORDER — SUGAMMADEX SODIUM 200 MG/2ML IV SOLN
INTRAVENOUS | Status: DC | PRN
  Administered 2021-02-20: 200 mg via INTRAVENOUS

## 2021-02-20 MED ORDER — ROCURONIUM BROMIDE 100 MG/10ML IV SOLN
INTRAVENOUS | Status: DC | PRN
  Administered 2021-02-20: 60 mg via INTRAVENOUS

## 2021-02-20 MED ORDER — ROCURONIUM BROMIDE 10 MG/ML (PF) SYRINGE
PREFILLED_SYRINGE | INTRAVENOUS | Status: AC
  Filled 2021-02-20: qty 10

## 2021-02-20 MED ORDER — PHENYLEPHRINE HCL (PRESSORS) 10 MG/ML IV SOLN
INTRAVENOUS | Status: DC | PRN
  Administered 2021-02-20: 100 ug via INTRAVENOUS
  Administered 2021-02-20: 200 ug via INTRAVENOUS
  Administered 2021-02-20: 100 ug via INTRAVENOUS
  Administered 2021-02-20: 200 ug via INTRAVENOUS
  Administered 2021-02-20: 50 ug via INTRAVENOUS

## 2021-02-20 MED ORDER — ONDANSETRON HCL 4 MG/2ML IJ SOLN
INTRAMUSCULAR | Status: DC | PRN
  Administered 2021-02-20: 4 mg via INTRAVENOUS

## 2021-02-20 MED ORDER — BUPIVACAINE-EPINEPHRINE (PF) 0.25% -1:200000 IJ SOLN
INTRAMUSCULAR | Status: DC | PRN
  Administered 2021-02-20: 30 mL via PERINEURAL

## 2021-02-20 MED ORDER — SUCCINYLCHOLINE CHLORIDE 200 MG/10ML IV SOSY
PREFILLED_SYRINGE | INTRAVENOUS | Status: AC
  Filled 2021-02-20: qty 10

## 2021-02-20 MED ORDER — SEVOFLURANE IN SOLN
RESPIRATORY_TRACT | Status: AC
  Filled 2021-02-20: qty 250

## 2021-02-20 MED ORDER — MIDAZOLAM HCL 2 MG/2ML IJ SOLN
INTRAMUSCULAR | Status: AC
  Filled 2021-02-20: qty 2

## 2021-02-20 MED ORDER — ACETAMINOPHEN 10 MG/ML IV SOLN
INTRAVENOUS | Status: AC
  Filled 2021-02-20: qty 100

## 2021-02-20 MED ORDER — ONDANSETRON HCL 4 MG/2ML IJ SOLN
INTRAMUSCULAR | Status: AC
  Filled 2021-02-20: qty 2

## 2021-02-20 SURGICAL SUPPLY — 56 items
BULB RESERV EVAC DRAIN JP 100C (MISCELLANEOUS) ×2 IMPLANT
CANNULA REDUC XI 12-8 STAPL (CANNULA) ×1
CANNULA REDUCER 12-8 DVNC XI (CANNULA) ×1 IMPLANT
CHLORAPREP W/TINT 26 (MISCELLANEOUS) ×2 IMPLANT
CLIP LIGATING HEMO O LOK GREEN (MISCELLANEOUS) ×2 IMPLANT
DECANTER SPIKE VIAL GLASS SM (MISCELLANEOUS) ×2 IMPLANT
DEFOGGER SCOPE WARMER CLEARIFY (MISCELLANEOUS) ×2 IMPLANT
DERMABOND ADVANCED (GAUZE/BANDAGES/DRESSINGS) ×1
DERMABOND ADVANCED .7 DNX12 (GAUZE/BANDAGES/DRESSINGS) ×1 IMPLANT
DRAIN CHANNEL JP 19F (MISCELLANEOUS) ×2 IMPLANT
DRAPE ARM DVNC X/XI (DISPOSABLE) ×4 IMPLANT
DRAPE COLUMN DVNC XI (DISPOSABLE) ×1 IMPLANT
DRAPE DA VINCI XI ARM (DISPOSABLE) ×4
DRAPE DA VINCI XI COLUMN (DISPOSABLE) ×1
DRSG TEGADERM 4X4.75 (GAUZE/BANDAGES/DRESSINGS) ×2 IMPLANT
ELECT CAUTERY BLADE 6.4 (BLADE) ×2 IMPLANT
ELECT REM PT RETURN 9FT ADLT (ELECTROSURGICAL) ×2
ELECTRODE REM PT RTRN 9FT ADLT (ELECTROSURGICAL) ×1 IMPLANT
GAUZE 4X4 16PLY ~~LOC~~+RFID DBL (SPONGE) ×2 IMPLANT
GLOVE SURG ENC MOIS LTX SZ7 (GLOVE) ×4 IMPLANT
GOWN STRL REUS W/ TWL LRG LVL3 (GOWN DISPOSABLE) ×4 IMPLANT
GOWN STRL REUS W/TWL LRG LVL3 (GOWN DISPOSABLE) ×4
IRRIGATION STRYKERFLOW (MISCELLANEOUS) ×1 IMPLANT
IRRIGATOR STRYKERFLOW (MISCELLANEOUS) ×2
IV NS 1000ML (IV SOLUTION) ×1
IV NS 1000ML BAXH (IV SOLUTION) ×1 IMPLANT
KIT PINK PAD W/HEAD ARE REST (MISCELLANEOUS) ×2 IMPLANT
KIT PINK PAD W/HEAD ARM REST (MISCELLANEOUS) ×1 IMPLANT
LABEL OR SOLS (LABEL) ×2 IMPLANT
MANIFOLD NEPTUNE II (INSTRUMENTS) ×2 IMPLANT
NEEDLE HYPO 22GX1.5 SAFETY (NEEDLE) ×2 IMPLANT
NS IRRIG 500ML POUR BTL (IV SOLUTION) ×2 IMPLANT
OBTURATOR OPTICAL STANDARD 8MM (TROCAR) ×1
OBTURATOR OPTICAL STND 8 DVNC (TROCAR) ×1
OBTURATOR OPTICALSTD 8 DVNC (TROCAR) ×1 IMPLANT
PACK LAP CHOLECYSTECTOMY (MISCELLANEOUS) ×2 IMPLANT
PENCIL ELECTRO HAND CTR (MISCELLANEOUS) ×2 IMPLANT
POUCH SPECIMEN RETRIEVAL 10MM (ENDOMECHANICALS) ×2 IMPLANT
SEAL CANN UNIV 5-8 DVNC XI (MISCELLANEOUS) ×3 IMPLANT
SEAL XI 5MM-8MM UNIVERSAL (MISCELLANEOUS) ×3
SET TUBE SMOKE EVAC HIGH FLOW (TUBING) ×2 IMPLANT
SOLUTION ELECTROLUBE (MISCELLANEOUS) ×2 IMPLANT
SPONGE DRAIN TRACH 4X4 STRL 2S (GAUZE/BANDAGES/DRESSINGS) ×2 IMPLANT
SPONGE T-LAP 18X18 ~~LOC~~+RFID (SPONGE) ×2 IMPLANT
SPONGE T-LAP 4X18 ~~LOC~~+RFID (SPONGE) IMPLANT
STAPLER CANNULA SEAL DVNC XI (STAPLE) ×1 IMPLANT
STAPLER CANNULA SEAL XI (STAPLE) ×1
SUT ETHILON 3-0 FS-10 30 BLK (SUTURE) ×2
SUT MNCRL AB 4-0 PS2 18 (SUTURE) ×2 IMPLANT
SUT VICRYL 0 AB UR-6 (SUTURE) ×4 IMPLANT
SUTURE EHLN 3-0 FS-10 30 BLK (SUTURE) ×1 IMPLANT
SYR 20ML LL LF (SYRINGE) ×2 IMPLANT
SYR 30ML LL (SYRINGE) ×2 IMPLANT
TAPE TRANSPORE STRL 2 31045 (GAUZE/BANDAGES/DRESSINGS) ×2 IMPLANT
TROCAR BALLN GELPORT 12X130M (ENDOMECHANICALS) ×2 IMPLANT
WATER STERILE IRR 500ML POUR (IV SOLUTION) ×2 IMPLANT

## 2021-02-20 NOTE — Progress Notes (Signed)
PROGRESS NOTE  Linda Clark JJO:[id number] DOB: [birth year] DOA: 02/18/2021 PCP: Preston Fleeting, MD   LOS: 2 days   Brief Narrative / Interim history: 60 year old female with history of HTN, HLD, GERD, comes into the hospital with abdominal pain associated with vomiting.  Pain was mainly epigastric area radiating to the back.  She was found to have acute pancreatitis with a significantly elevated lipase.  Right upper quadrant ultrasound showed gallbladder sludge, underwent an MRCP which showed moderate to severe acute pancreatitis and dependent material in the gallbladder likely representing sludge and possible small stones.  CBD was mildly dilated but no choledocholithiasis was seen.  GI and general surgery was consulted and she was admitted to the hospital.  Subjective / 24h Interval events: She is doing better this morning, still has some midepigastric abdominal pain but appreciates that she is much improved  Interpreter iPad used for interview  Assessment & Plan: Principal Problem Acute pancreatitis, likely gallstone related -possibly she passed a gallstone given CBD dilation but there is no current choledocholithiasis noticed on the MRCP.  Gastroenterology and general surgery consulted, plans are in place for her to undergo cholecystectomy, defer timing to general surgery.  Continue n.p.o. for now, IV fluids, symptomatic management  Active Problems Hypertensive urgency, essential hypertension-blood pressure now normalized, continue IV as needed's, keep strict n.p.o. at home she is on amlodipine, Coreg as well as Diovan  Hypokalemia-continue to replace and monitor  Hyperlipidemia-hold statin, at home she is on atorvastatin and Zetia  Obesity, class I-BMI 31, she would benefit from weight loss  GERD-hold PPI  Scheduled Meds:  (feeding supplement) PROSource Plus  30 mL Oral TID BM   Chlorhexidine Gluconate Cloth  6 each Topical Q0600   enoxaparin  (LOVENOX) injection  40 mg Subcutaneous Q24H   feeding supplement  1 Container Oral TID BM   HYDROmorphone HCl  0.5 mg Oral Once   indocyanine green  5 mg Intravenous Once   labetalol  5 mg Intravenous Q4H   multivitamin with minerals  1 tablet Oral Daily   pantoprazole (PROTONIX) IV  40 mg Intravenous Q12H   Continuous Infusions:  cefTRIAXone (ROCEPHIN)  IV Stopped (02/19/21 2054)   lactated ringers 125 mL/hr at 02/20/21 0457   potassium chloride 10 mEq (02/20/21 1048)   PRN Meds:.HYDROmorphone (DILAUDID) injection, ondansetron (ZOFRAN) IV  Diet Orders (From admission, onward)     Start     Ordered   02/20/21 0001  Diet NPO time specified  Diet effective midnight        02/19/21 1432            DVT prophylaxis: enoxaparin (LOVENOX) injection 40 mg Start: 02/18/21 1745 SCDs Start: 02/18/21 1732     Code Status: Full Code  Family Communication: No family at bedside  Status is: Inpatient  Remains inpatient appropriate because: pending surgery   Level of care: Med-Surg  Consultants:  General surgery GI  Procedures:  None  Microbiology  none  Antimicrobials: Ceftriaxone     Objective: Vitals:   02/19/21 2029 02/20/21 0006 02/20/21 0442 02/20/21 0825  BP: 116/75 (!) 139/91 129/87 (!) 131/91  Pulse: 85 89 (!) 103 94  Resp: 20 18 16 18   Temp: 100 F (37.8 C) 99.4 F (37.4 C) 98.4 F (36.9 C) 98 F (36.7 C)  TempSrc: Oral Oral Oral Oral  SpO2: 93% 96% 91% 97%  Weight:      Height:        Intake/Output Summary (  Last 24 hours) at 02/20/2021 1107 Last data filed at 02/20/2021 0449 Gross per 24 hour  Intake 3387.98 ml  Output --  Net 3387.98 ml   Filed Weights   02/18/21 0953  Weight: 77.7 kg    Examination:  Constitutional: NAD Eyes: no scleral icterus ENMT: Mucous membranes are moist.  Neck: normal, supple Respiratory: clear to auscultation bilaterally, no wheezing, no crackles. Normal respiratory effort.  Cardiovascular: Regular rate  and rhythm, no murmurs / rubs / gallops. No LE edema.  Abdomen: Tender to palpation mainly in the epigastric area, no guarding or rebound.  Bowel sounds positive Musculoskeletal: no clubbing / cyanosis.  Skin: no rashes Neurologic: CN 2-12 grossly intact. Strength 5/5 in all 4.   Data Reviewed: I have independently reviewed following labs and imaging studies   CBC: Recent Labs  Lab 02/17/21 1209 02/18/21 1005 02/19/21 0416  WBC 8.9 9.6 14.0*  NEUTROABS 6.4  --   --   HGB 16.0* 16.3* 15.6*  HCT 49.6* 50.5* 48.0*  MCV 92.5 93.5 92.8  PLT 301 341 291   Basic Metabolic Panel: Recent Labs  Lab 02/17/21 1209 02/18/21 1005 02/19/21 0416 02/20/21 0415  NA 138 138 138 138  K 4.4 4.3 3.3* 3.3*  CL 102 102 103 102  CO2 28 28 27 29   GLUCOSE 111* 156* 122* 109*  BUN 17 15 18 15   CREATININE 0.55 0.65 0.43* 0.49  CALCIUM 9.5 9.3 8.8* 8.5*  MG  --   --  2.4  --   PHOS  --   --  3.6  --    Liver Function Tests: Recent Labs  Lab 02/17/21 1209 02/18/21 1005 02/19/21 0416 02/20/21 0415  AST 54* 367* 225* 48*  ALT 35 296* 301* 151*  ALKPHOS 65 85 82 58  BILITOT 0.9 3.4* 2.1* 1.5*  PROT 8.0 8.3* 7.0 7.0  ALBUMIN 4.5 4.6 3.9 3.9   Coagulation Profile: Recent Labs  Lab 02/17/21 1321  INR 0.9   HbA1C: No results for input(s): HGBA1C in the last 72 hours. CBG: No results for input(s): GLUCAP in the last 168 hours.  Recent Results (from the past 240 hour(s))  Resp Panel by RT-PCR (Flu A&B, Covid) Nasopharyngeal Swab     Status: None   Collection Time: 02/17/21  1:21 PM   Specimen: Nasopharyngeal Swab; Nasopharyngeal(NP) swabs in vial transport medium  Result Value Ref Range Status   SARS Coronavirus 2 by RT PCR NEGATIVE NEGATIVE Final    Comment: (NOTE) SARS-CoV-2 target nucleic acids are NOT DETECTED.  The SARS-CoV-2 RNA is generally detectable in upper respiratory specimens during the acute phase of infection. The lowest concentration of SARS-CoV-2 viral copies this  assay can detect is 138 copies/mL. A negative result does not preclude SARS-Cov-2 infection and should not be used as the sole basis for treatment or other patient management decisions. A negative result may occur with  improper specimen collection/handling, submission of specimen other than nasopharyngeal swab, presence of viral mutation(s) within the areas targeted by this assay, and inadequate number of viral copies(<138 copies/mL). A negative result must be combined with clinical observations, patient history, and epidemiological information. The expected result is Negative.  Fact Sheet for Patients:  BloggerCourse.com  Fact Sheet for Healthcare Providers:  SeriousBroker.it  This test is no t yet approved or cleared by the [place] FDA and  has been authorized for detection and/or diagnosis of SARS-CoV-2 by FDA under an Emergency Use Authorization (EUA). This EUA will remain  in  effect (meaning this test can be used) for the duration of the COVID-19 declaration under Section 564(b)(1) of the Act, 21 U.S.C.section 360bbb-3(b)(1), unless the authorization is terminated  or revoked sooner.       Influenza A by PCR NEGATIVE NEGATIVE Final   Influenza B by PCR NEGATIVE NEGATIVE Final    Comment: (NOTE) The Xpert Xpress SARS-CoV-2/FLU/RSV plus assay is intended as an aid in the diagnosis of influenza from Nasopharyngeal swab specimens and should not be used as a sole basis for treatment. Nasal washings and aspirates are unacceptable for Xpert Xpress SARS-CoV-2/FLU/RSV testing.  Fact Sheet for Patients: BloggerCourse.com  Fact Sheet for Healthcare Providers: SeriousBroker.it  This test is not yet approved or cleared by the [place] FDA and has been authorized for detection and/or diagnosis of SARS-CoV-2 by FDA under an Emergency Use Authorization (EUA). This EUA will  remain in effect (meaning this test can be used) for the duration of the COVID-19 declaration under Section 564(b)(1) of the Act, 21 U.S.C. section 360bbb-3(b)(1), unless the authorization is terminated or revoked.  Performed at [facility], [address] [place], [place] [address]   CSF culture w Gram Stain     Status: None (Preliminary result)   Collection Time: 02/17/21  3:11 PM   Specimen: CSF; Cerebrospinal Fluid  Result Value Ref Range Status   Specimen Description   Final    CSF Performed at [facility], [address], [place], [place] [address]    Special Requests   Final    NONE Performed at [facility], [address] [place], [place] [address]    Gram Stain   Final    CYTOSPIN SLIDE WBC SEEN NO RBC SEEN NO ORGANISMS SEEN Performed at [facility], [address], [place], [place] [address]    Culture   Final    NO GROWTH 3 DAYS Performed at [facility], [address], [place], [place] [address]    Report Status PENDING  Incomplete  Resp Panel by RT-PCR (Flu A&B, Covid) Nasopharyngeal Swab     Status: None   Collection Time: 02/18/21 12:10 PM   Specimen: Nasopharyngeal Swab; Nasopharyngeal(NP) swabs in vial transport medium  Result Value Ref Range Status   SARS Coronavirus 2 by RT PCR NEGATIVE NEGATIVE Final    Comment: (NOTE) SARS-CoV-2 target nucleic acids are NOT DETECTED.  The SARS-CoV-2 RNA is generally detectable in upper respiratory specimens during the acute phase of infection. The lowest concentration of SARS-CoV-2 viral copies this assay can detect is 138 copies/mL. A negative result does not preclude SARS-Cov-2 infection and should not be used as the sole basis for treatment or other patient management decisions. A negative result may occur with  improper specimen collection/handling, submission of specimen other than nasopharyngeal swab, presence of viral mutation(s) within the areas targeted  by this assay, and inadequate number of viral copies(<138 copies/mL). A negative result must be combined with clinical observations, patient history, and epidemiological information. The expected result is Negative.  Fact Sheet for Patients:  BloggerCourse.com  Fact Sheet for Healthcare Providers:  SeriousBroker.it  This test is no t yet approved or cleared by the [place] FDA and  has been authorized for detection and/or diagnosis of SARS-CoV-2 by FDA under an Emergency Use Authorization (EUA). This EUA will remain  in effect (meaning this test can be used) for the duration of the COVID-19 declaration under Section 564(b)(1) of the Act, 21 U.S.C.section 360bbb-3(b)(1), unless the authorization  is terminated  or revoked sooner.       Influenza A by PCR NEGATIVE NEGATIVE Final   Influenza B by PCR NEGATIVE NEGATIVE Final    Comment: (NOTE) The Xpert Xpress SARS-CoV-2/FLU/RSV plus assay is intended as an aid in the diagnosis of influenza from Nasopharyngeal swab specimens and should not be used as a sole basis for treatment. Nasal washings and aspirates are unacceptable for Xpert Xpress SARS-CoV-2/FLU/RSV testing.  Fact Sheet for Patients: BloggerCourse.com  Fact Sheet for Healthcare Providers: SeriousBroker.it  This test is not yet approved or cleared by the [place] FDA and has been authorized for detection and/or diagnosis of SARS-CoV-2 by FDA under an Emergency Use Authorization (EUA). This EUA will remain in effect (meaning this test can be used) for the duration of the COVID-19 declaration under Section 564(b)(1) of the Act, 21 U.S.C. section 360bbb-3(b)(1), unless the authorization is terminated or revoked.  Performed at [facility], [address] [place], [place] [address]      Radiology Studies: No results found.  Pamella Pert, MD,  PhD [facility]  Between 7 am - 7 pm I am available, please contact me via Amion (for emergencies) or Securechat (non urgent messages)  Between 7 pm - 7 am I am not available, please contact night coverage MD/APP via Amion

## 2021-02-20 NOTE — Consult Note (Signed)
Pharmacy Antibiotic Note  Linda Clark is a 60 y.o. female admitted on 02/18/2021 with Severe acute Cholecystitis with empyema of the gallbladder  Pharmacy has been consulted for Zosyn dosing.  Plan: Zosyn 3.375g IV q8h (4 hour infusion).  Height: 5\' 2"  (157.5 cm) Weight: 77.7 kg (171 lb 4.8 oz) IBW/kg (Calculated) : 50.1  Temp (24hrs), Avg:98.9 F (37.2 C), Min:98 F (36.7 C), Max:100 F (37.8 C)  Recent Labs  Lab 02/17/21 1209 02/18/21 1005 02/19/21 0416 02/20/21 0415  WBC 8.9 9.6 14.0*  --   CREATININE 0.55 0.65 0.43* 0.49    Estimated Creatinine Clearance: 73.9 mL/min (by C-G formula based on SCr of 0.49 mg/dL).    Allergies  Allergen Reactions   Aspirin Anaphylaxis   Aspirin-Dipyridamole Er Anaphylaxis    Antimicrobials this admission: 11/25 Ceftriaxone  >> 11/26 11/26 Zosyn >>   Dose adjustments this admission:   Microbiology results:   Thank you for allowing pharmacy to be a part of this patient's care.  12/26, PharmD, BCPS Clinical Pharmacist   02/20/2021 5:32 PM

## 2021-02-20 NOTE — Anesthesia Preprocedure Evaluation (Addendum)
Anesthesia Evaluation  Patient identified by MRN, date of birth, ID band Patient awake    Reviewed: Allergy & Precautions, NPO status , Patient's Chart, lab work & pertinent test results  Airway Mallampati: II  TM Distance: >3 FB Neck ROM: Full    Dental  (+) Upper Dentures   Pulmonary  Snoring          Cardiovascular hypertension, + CAD    Hyperlipidemia Atypical chest pain  09/29/20 myocardial perfusion: - Probably normal myocardial perfusion study  - There is a very small, subtle, fixed perfusion defect involving the apicalsegment. This is likely due to artifact (adjacent gut activity, motion and misregistration of attenuation CT, attenuation).  - Left ventricular systolic function is hyperdynamic. Post stress the ejection fraction is calculated at 84%.  - No significant coronary calcifications were noted on the attenuation CT  - Incidentally noted on the attenuation CT scan are several scattered liver lesions most consistent with hepatic cysts. Clinical and / or ultrasound correlation recommended     Neuro/Psych negative psych ROS   GI/Hepatic Hepatic steatosis   Endo/Other  obesity  Renal/GU negative Renal ROS     Musculoskeletal   Abdominal (+) + obese,   Peds  Hematology negative hematology ROS (+)   Anesthesia Other Findings   Reproductive/Obstetrics                            Anesthesia Physical Anesthesia Plan  ASA: 3  Anesthesia Plan: General   Post-op Pain Management:    Induction: Intravenous  PONV Risk Score and Plan:   Airway Management Planned: Oral ETT  Additional Equipment:   Intra-op Plan:   Post-operative Plan: Extubation in OR  Informed Consent: I have reviewed the patients History and Physical, chart, labs and discussed the procedure including the risks, benefits and alternatives for the proposed anesthesia with the patient or authorized representative  who has indicated his/her understanding and acceptance.     Dental advisory given and Interpreter used for interveiw  Plan Discussed with:   Anesthesia Plan Comments: (OSA score 3)      Anesthesia Quick Evaluation

## 2021-02-20 NOTE — Op Note (Signed)
Robotic assisted laparoscopic Cholecystectomy  Pre-operative Diagnosis: Gallstone Pancreatitis  Post-operative Diagnosis: Gallstone pancreatitis, Acute severe cholecystitis  Procedure:  Robotic assisted laparoscopic Cholecystectomy  Surgeon: Sterling Big, MD FACS  Anesthesia: Gen. with endotracheal tube  Findings: Severe acute Cholecystitis with empyema of the gallbladder Cholelithiasis  Estimated Blood Loss: 20cc       Specimens: Gallbladder           Complications: none   Procedure Details  The patient was seen again in the Holding Room. The benefits, complications, treatment options, and expected outcomes were discussed with the patient. The risks of bleeding, infection, recurrence of symptoms, failure to resolve symptoms, bile duct damage, bile duct leak, retained common bile duct stone, bowel injury, any of which could require further surgery and/or ERCP, stent, or papillotomy were reviewed with the patient. The likelihood of improving the patient's symptoms with return to their baseline status is good.  The patient and/or family concurred with the proposed plan, giving informed consent.  The patient was taken to Operating Room, identified  and the procedure verified as Laparoscopic Cholecystectomy.  A Time Out was held and the above information confirmed.  Prior to the induction of general anesthesia, antibiotic prophylaxis was administered. VTE prophylaxis was in place. General endotracheal anesthesia was then administered and tolerated well. After the induction, the abdomen was prepped with Chloraprep and draped in the sterile fashion. The patient was positioned in the supine position.  Cut down technique was used to enter the abdominal cavity and a Hasson trochar was placed after two vicryl stitches were anchored to the fascia. Pneumoperitoneum was then created with CO2 and tolerated well without any adverse changes in the patient's vital signs.  Three 8-mm ports were placed  under direct vision. All skin incisions  were infiltrated with a local anesthetic agent before making the incision and placing the trocars.   The patient was positioned  in reverse Trendelenburg, robot was brought to the surgical field and docked in the standard fashion.  We made sure all the instrumentation was kept indirect view at all times and that there were no collision between the arms. I scrubbed out and went to the console.  The gallbladder was identified, there was severe acute Cholecystitis with empyema of the gallbladder. the fundus grasped and retracted cephalad. Adhesions were lysed , thick adhesions and severe inflammatory response. While dissection the wall was rupture and some purulence as well as bile was drained from the GB, this was suctioned in the standard fashion. The infundibulum was grasped and retracted laterally, exposing the peritoneum overlying the triangle of Calot. This was then divided and exposed in a blunt fashion. An extended critical view of the cystic duct and cystic artery was obtained.  The cystic duct was clearly identified and bluntly dissected.   Artery and duct were double clipped and divided. Using ICG cholangiography we visualize the cystic duct and  CBD, no evidence of bile injuries. The gallbladder was taken from the gallbladder fossa in a retrograde fashion with the electrocautery.  Hemostasis was achieved with the electrocautery. nspection of the right upper quadrant was performed. No bleeding, bile duct injury or leak, or bowel injury was noted. I decided to place 86 Blake drain due to empyema of the gallbladder. Robotic instruments and robotic arms were undocked in the standard fashion.  I scrubbed back in.  The gallbladder was removed and placed in an Endocatch bag.   Pneumoperitoneum was released.  The periumbilical port site was closed with interrumpted 0  Vicryl sutures. 4-0 subcuticular Monocryl was used to close the skin. Dermabond was  applied.   The patient was then extubated and brought to the recovery room in stable condition. Sponge, lap, and needle counts were correct at closure and at the conclusion of the case.         We will switch antibiotics to zosyn due to severe inflammatory response and empyema of the gallbladder      Sterling Big, MD, FACS

## 2021-02-20 NOTE — Anesthesia Postprocedure Evaluation (Signed)
Anesthesia Post Note  Patient: Linda Clark Del Philipp Ovens Donoso  Procedure(s) Performed: XI ROBOTIC ASSISTED LAPAROSCOPIC CHOLECYSTECTOMY (Abdomen) INDOCYANINE GREEN FLUORESCENCE IMAGING (ICG) (Abdomen)  Patient location during evaluation: PACU Anesthesia Type: General Level of consciousness: awake and awake and alert Pain management: pain level controlled Vital Signs Assessment: post-procedure vital signs reviewed and stable Respiratory status: spontaneous breathing, nonlabored ventilation and respiratory function stable Cardiovascular status: blood pressure returned to baseline Anesthetic complications: no   No notable events documented.   Last Vitals:  Vitals:   02/20/21 1715 02/20/21 1730  BP: 134/86 138/89  Pulse: 89 95  Resp: 18 20  Temp:  36.8 C  SpO2: 94% 93%    Last Pain:  Vitals:   02/20/21 1730  TempSrc:   PainSc: 0-No pain                 Johny Drilling

## 2021-02-20 NOTE — Progress Notes (Signed)
Patient received awake and alert in bed with son at bedside. Conversed with patient via Spanish interpreter Byrd Hesselbach [phone]. Assessment completed, pain meds administered upon request. Spoke with Surgeon Dr. Everlene Farrier concerning patient's diet. Ordered clear liquids until reevaluation in the morning. Patient tolerating this diet after PRN Zofran administered. JP drain functioning without difficulties. Ice applied to incisions. Call bell kept within reach. Bed in lowest position and all personal possession kept with in reach. Will continue to monitor and endorse.

## 2021-02-20 NOTE — Progress Notes (Signed)
  Preoperative Review   Patient is met in the preoperative holding area. The history is reviewed in the chart and with the patient. I personally reviewed the options and rationale as well as the risks of this procedure that have been previously discussed with the patient. All questions asked by the patient and/or family were answered to their satisfaction. Pain significantly improved, lipase and all her physiologic parameter improving. I do think it is safe to proceed w Chole.   I discussed the procedure in detail.  The patient was given Neurosurgeon.  We discussed the risks and benefits of a laparoscopic cholecystectomy and possible cholangiogram including, but not limited to bleeding, infection, injury to surrounding structures such as the intestine or liver, bile leak, retained gallstones, need to convert to an open procedure, prolonged diarrhea, blood clots such as  DVT, common bile duct injury, anesthesia risks, and possible need for additional procedures.  The likelihood of improvement in symptoms and return to the patient's normal status is good. We discussed the typical post-operative recovery course.   Patient agrees to proceed with this procedure at this time.  Caroleen Hamman M.D. FACS

## 2021-02-20 NOTE — Anesthesia Procedure Notes (Signed)
Procedure Name: Intubation Date/Time: 02/20/2021 3:45 PM Performed by: Garner Nash, CRNA Pre-anesthesia Checklist: Patient identified, Emergency Drugs available, Suction available and Patient being monitored Patient Re-evaluated:Patient Re-evaluated prior to induction Oxygen Delivery Method: Circle system utilized Preoxygenation: Pre-oxygenation with 100% oxygen Induction Type: IV induction Ventilation: Mask ventilation without difficulty Laryngoscope Size: Mac and 3 Grade View: Grade I Tube type: Oral Tube size: 6.5 mm Number of attempts: 1 Airway Equipment and Method: Stylet and Oral airway Placement Confirmation: ETT inserted through vocal cords under direct vision, positive ETCO2 and breath sounds checked- equal and bilateral Tube secured with: Tape Dental Injury: Teeth and Oropharynx as per pre-operative assessment

## 2021-02-20 NOTE — Transfer of Care (Signed)
Immediate Anesthesia Transfer of Care Note  Patient: Linda Clark Del Philipp Ovens Donoso  Procedure(s) Performed: XI ROBOTIC ASSISTED LAPAROSCOPIC CHOLECYSTECTOMY (Abdomen) INDOCYANINE GREEN FLUORESCENCE IMAGING (ICG) (Abdomen)  Patient Location: PACU  Anesthesia Type:General  Level of Consciousness: drowsy  Airway & Oxygen Therapy: Patient connected to nasal cannula oxygen  Post-op Assessment: Report given to RN  Post vital signs: stable  Last Vitals:  Vitals Value Taken Time  BP    Temp    Pulse 92 02/20/21 1704  Resp 14 02/20/21 1704  SpO2 92 % 02/20/21 1704  Vitals shown include unvalidated device data.  Last Pain:  Vitals:   02/20/21 1156  TempSrc: Oral  PainSc:          Complications: No notable events documented.

## 2021-02-21 LAB — COMPREHENSIVE METABOLIC PANEL
ALT: 99 U/L — ABNORMAL HIGH (ref 0–44)
AST: 43 U/L — ABNORMAL HIGH (ref 15–41)
Albumin: 3 g/dL — ABNORMAL LOW (ref 3.5–5.0)
Alkaline Phosphatase: 45 U/L (ref 38–126)
Anion gap: 5 (ref 5–15)
BUN: 16 mg/dL (ref 6–20)
CO2: 30 mmol/L (ref 22–32)
Calcium: 8.1 mg/dL — ABNORMAL LOW (ref 8.9–10.3)
Chloride: 103 mmol/L (ref 98–111)
Creatinine, Ser: 0.4 mg/dL — ABNORMAL LOW (ref 0.44–1.00)
GFR, Estimated: >60 mL/min (ref >60)
Glucose, Bld: 133 mg/dL — ABNORMAL HIGH (ref 70–99)
Potassium: 3.3 mmol/L — ABNORMAL LOW (ref 3.5–5.1)
Sodium: 138 mmol/L (ref 135–145)
Total Bilirubin: 0.8 mg/dL (ref 0.3–1.2)
Total Protein: 6.2 g/dL — ABNORMAL LOW (ref 6.5–8.1)

## 2021-02-21 LAB — CBC
HCT: 39 % (ref 36.0–46.0)
Hemoglobin: 12.5 g/dL (ref 12.0–15.0)
MCH: 30 pg (ref 26.0–34.0)
MCHC: 32.1 g/dL (ref 30.0–36.0)
MCV: 93.5 fL (ref 80.0–100.0)
Platelets: 244 10*3/uL (ref 150–400)
RBC: 4.17 MIL/uL (ref 3.87–5.11)
RDW: 13.1 % (ref 11.5–15.5)
WBC: 11.3 10*3/uL — ABNORMAL HIGH (ref 4.0–10.5)
nRBC: 0 % (ref 0.0–0.2)

## 2021-02-21 LAB — CSF CULTURE W GRAM STAIN: Culture: NO GROWTH

## 2021-02-21 MED ORDER — POTASSIUM CHLORIDE CRYS ER 20 MEQ PO TBCR: 40.0000 meq | EXTENDED_RELEASE_TABLET | Freq: Once | ORAL | Status: DC

## 2021-02-21 MED ORDER — SIMETHICONE 80 MG PO CHEW
80.0000 mg | CHEWABLE_TABLET | Freq: Four times a day (QID) | ORAL | Status: DC | PRN
  Administered 2021-02-21 – 2021-02-24 (×5): 80 mg via ORAL
  Filled 2021-02-21 (×11): qty 1

## 2021-02-21 MED ORDER — AMLODIPINE BESYLATE 5 MG PO TABS
5.0000 mg | ORAL_TABLET | Freq: Every day | ORAL | Status: DC
  Administered 2021-02-21: 14:00:00 5 mg via ORAL

## 2021-02-21 MED ORDER — POTASSIUM CHLORIDE CRYS ER 20 MEQ PO TBCR
40.0000 meq | EXTENDED_RELEASE_TABLET | Freq: Once | ORAL | Status: AC
  Administered 2021-02-21: 14:00:00 40 meq via ORAL
  Filled 2021-02-21: qty 2

## 2021-02-21 MED ORDER — CARVEDILOL 6.25 MG PO TABS
12.5000 mg | ORAL_TABLET | Freq: Two times a day (BID) | ORAL | Status: DC
  Administered 2021-02-21 – 2021-02-22 (×2): 12.5 mg via ORAL
  Filled 2021-02-21 (×2): qty 2

## 2021-02-21 NOTE — Progress Notes (Signed)
Patient c/o sharp, severe pain to left abdomen. Patient states she feels like she "can't release the gas." Encouraged frequent ambulation, pt verbalizes understanding. Pt reports relief for about 1hr after administration of prn simethicone, she was able to fall asleep.  Awakened by pain and nausea with 1 episode of emesis. Zofran and dilaudid given x1. Heated blanket to abdomen.   Abdomen distended, lap sites intact no drainage noted. JP drain with serosanguineous output. JP dressing notably soiled with serosanguineous drainage. Dressing was clean and dry on assessment at 2000. Dressing changed by this nurse.

## 2021-02-21 NOTE — Progress Notes (Signed)
PROGRESS NOTE  Linda Clark [place] PYP:[id number] DOB: [birth year] DOA: 02/18/2021 PCP: Preston Fleeting, MD   LOS: 3 days   Brief Narrative / Interim history: 60 year old female with history of HTN, HLD, GERD, comes into the hospital with abdominal pain associated with vomiting.  Pain was mainly epigastric area radiating to the back.  She was found to have acute pancreatitis with a significantly elevated lipase.  Right upper quadrant ultrasound showed gallbladder sludge, underwent an MRCP which showed moderate to severe acute pancreatitis and dependent material in the gallbladder likely representing sludge and possible small stones.  CBD was mildly dilated but no choledocholithiasis was seen.  GI and general surgery was consulted and she was admitted to the hospital.  Subjective / 24h Interval events: Doing well this morning, feels better.  Less abdominal pain.  No nausea, tolerating liquids  Interpreter iPad used for interview  Assessment & Plan: Principal Problem Acute pancreatitis, likely gallstone related -possibly she passed a gallstone given CBD dilation but there is no current choledocholithiasis noticed on the MRCP.  Gastroenterology and general surgery consulted.  Patient was taken to the OR on 11/26 found to have severe acute cholecystitis with gallbladder empyema. -Currently on Zosyn, continue.  She has a drain in place, monitor output and removal per general surgery  Active Problems Hypertensive urgency, essential hypertension-blood pressure now normalized,  at home she is on amlodipine, Coreg as well as Diovan, she is normotensive and continue to hold  Hypokalemia-continue to replace and monitor  Hyperlipidemia-hold statin, at home she is on atorvastatin and Zetia.  Monitor LFTs  Obesity, class I-BMI 31, she would benefit from weight loss  GERD-on IV PPI  Scheduled Meds:  (feeding supplement) PROSource Plus  30 mL Oral TID BM   Chlorhexidine  Gluconate Cloth  6 each Topical Q0600   enoxaparin (LOVENOX) injection  40 mg Subcutaneous Q24H   feeding supplement  1 Container Oral TID BM   HYDROmorphone HCl  0.5 mg Oral Once   labetalol  5 mg Intravenous Q4H   multivitamin with minerals  1 tablet Oral Daily   pantoprazole (PROTONIX) IV  40 mg Intravenous Q12H   Continuous Infusions:  lactated ringers 125 mL/hr at 02/21/21 0204   piperacillin-tazobactam (ZOSYN)  IV 3.375 g (02/21/21 0857)   PRN Meds:.HYDROmorphone (DILAUDID) injection, ondansetron (ZOFRAN) IV  Diet Orders (From admission, onward)     Start     Ordered   02/20/21 2312  Diet clear liquid Room service appropriate? Yes; Fluid consistency: Thin  Diet effective now       Question Answer Comment  Room service appropriate? Yes   Fluid consistency: Thin      02/20/21 2312            DVT prophylaxis: enoxaparin (LOVENOX) injection 40 mg Start: 02/18/21 1745 SCDs Start: 02/18/21 1732     Code Status: Full Code  Family Communication: No family at bedside  Status is: Inpatient  Remains inpatient appropriate because: Severity of illness, drain in place, antibiotics  Level of care: Med-Surg  Consultants:  General surgery GI  Procedures:  Laparoscopic cholecystectomy 11/26  Microbiology  none  Antimicrobials: Ceftriaxone 11/25-11/26 Zosyn 11/26 >>   Objective: Vitals:   02/20/21 2033 02/21/21 0017 02/21/21 0457 02/21/21 0726  BP: (!) 148/86 129/79 115/84 (!) 104/53  Pulse: (!) 106 88 90 77  Resp: 16 16 16 18   Temp: 98.6 F (37 C) 98.4 F (36.9 C) 98 F (36.7 C) 98.2 F (36.8 C)  TempSrc: Oral Oral Oral Oral  SpO2: 95% 93% 93% 95%  Weight:      Height:        Intake/Output Summary (Last 24 hours) at 02/21/2021 1034 Last data filed at 02/21/2021 0700 Gross per 24 hour  Intake 1684.76 ml  Output 210 ml  Net 1474.76 ml    Filed Weights   02/18/21 0953  Weight: 77.7 kg    Examination:  Constitutional: She is in no  distress Eyes: Anicteric ENMT: mmm Neck: normal, supple Respiratory: Clear bilaterally, no wheezing, no crackles Cardiovascular: Regular rate and rhythm, no murmurs, no peripheral edema Abdomen: Mild tenderness, no guarding, drain in place Musculoskeletal: no clubbing / cyanosis.  Skin: No rashes seen Neurologic: No focal deficits  Data Reviewed: I have independently reviewed following labs and imaging studies   CBC: Recent Labs  Lab 02/17/21 1209 02/18/21 1005 02/19/21 0416  WBC 8.9 9.6 14.0*  NEUTROABS 6.4  --   --   HGB 16.0* 16.3* 15.6*  HCT 49.6* 50.5* 48.0*  MCV 92.5 93.5 92.8  PLT 301 341 291    Basic Metabolic Panel: Recent Labs  Lab 02/17/21 1209 02/18/21 1005 02/19/21 0416 02/20/21 0415  NA 138 138 138 138  K 4.4 4.3 3.3* 3.3*  CL 102 102 103 102  CO2 28 28 27 29   GLUCOSE 111* 156* 122* 109*  BUN 17 15 18 15   CREATININE 0.55 0.65 0.43* 0.49  CALCIUM 9.5 9.3 8.8* 8.5*  MG  --   --  2.4  --   PHOS  --   --  3.6  --     Liver Function Tests: Recent Labs  Lab 02/17/21 1209 02/18/21 1005 02/19/21 0416 02/20/21 0415  AST 54* 367* 225* 48*  ALT 35 296* 301* 151*  ALKPHOS 65 85 82 58  BILITOT 0.9 3.4* 2.1* 1.5*  PROT 8.0 8.3* 7.0 7.0  ALBUMIN 4.5 4.6 3.9 3.9    Coagulation Profile: Recent Labs  Lab 02/17/21 1321  INR 0.9    HbA1C: No results for input(s): HGBA1C in the last 72 hours. CBG: No results for input(s): GLUCAP in the last 168 hours.  Recent Results (from the past 240 hour(s))  Resp Panel by RT-PCR (Flu A&B, Covid) Nasopharyngeal Swab     Status: None   Collection Time: 02/17/21  1:21 PM   Specimen: Nasopharyngeal Swab; Nasopharyngeal(NP) swabs in vial transport medium  Result Value Ref Range Status   SARS Coronavirus 2 by RT PCR NEGATIVE NEGATIVE Final    Comment: (NOTE) SARS-CoV-2 target nucleic acids are NOT DETECTED.  The SARS-CoV-2 RNA is generally detectable in upper respiratory specimens during the acute phase of  infection. The lowest concentration of SARS-CoV-2 viral copies this assay can detect is 138 copies/mL. A negative result does not preclude SARS-Cov-2 infection and should not be used as the sole basis for treatment or other patient management decisions. A negative result may occur with  improper specimen collection/handling, submission of specimen other than nasopharyngeal swab, presence of viral mutation(s) within the areas targeted by this assay, and inadequate number of viral copies(<138 copies/mL). A negative result must be combined with clinical observations, patient history, and epidemiological information. The expected result is Negative.  Fact Sheet for Patients:  02/19/21  Fact Sheet for Healthcare Providers:  02/19/21  This test is no t yet approved or cleared by the [employer] FDA and  has been authorized for detection and/or diagnosis of SARS-CoV-2 by FDA under an Emergency Use Authorization (EUA).  This EUA will remain  in effect (meaning this test can be used) for the duration of the COVID-19 declaration under Section 564(b)(1) of the Act, 21 U.S.C.section 360bbb-3(b)(1), unless the authorization is terminated  or revoked sooner.       Influenza A by PCR NEGATIVE NEGATIVE Final   Influenza B by PCR NEGATIVE NEGATIVE Final    Comment: (NOTE) The Xpert Xpress SARS-CoV-2/FLU/RSV plus assay is intended as an aid in the diagnosis of influenza from Nasopharyngeal swab specimens and should not be used as a sole basis for treatment. Nasal washings and aspirates are unacceptable for Xpert Xpress SARS-CoV-2/FLU/RSV testing.  Fact Sheet for Patients: [employer]gerCourse[employer]com  Fact Sheet for Healthcare Providers: SeriousBroker.it  This test is not yet approved or cleared by the [place] FDA and has been authorized for detection and/or diagnosis of SARS-CoV-2  by FDA under an Emergency Use Authorization (EUA). This EUA will remain in effect (meaning this test can be used) for the duration of the COVID-19 declaration under Section 564(b)(1) of the Act, 21 U.S.C. section 360bbb-3(b)(1), unless the authorization is terminated or revoked.  Performed at [facility], [address] [place], [place] [address]   CSF culture w Gram Stain     Status: None   Collection Time: 02/17/21  3:11 PM   Specimen: CSF; Cerebrospinal Fluid  Result Value Ref Range Status   Specimen Description   Final    CSF Performed at [facility], [address] [place], [place] [address]    Special Requests   Final    NONE Performed at [facility], [address] [place], [place] [address]    Gram Stain   Final    CYTOSPIN SLIDE WBC SEEN NO RBC SEEN NO ORGANISMS SEEN Performed at [facility], [address] [place], [place] [address]    Culture   Final    NO GROWTH Performed at [facility], [address] [place], [place] [address]    Report Status 02/21/2021 FINAL  Final  Resp Panel by RT-PCR (Flu A&B, Covid) Nasopharyngeal Swab     Status: None   Collection Time: 02/18/21 12:10 PM   Specimen: Nasopharyngeal Swab; Nasopharyngeal(NP) swabs in vial transport medium  Result Value Ref Range Status   SARS Coronavirus 2 by RT PCR NEGATIVE NEGATIVE Final    Comment: (NOTE) SARS-CoV-2 target nucleic acids are NOT DETECTED.  The SARS-CoV-2 RNA is generally detectable in upper respiratory specimens during the acute phase of infection. The lowest concentration of SARS-CoV-2 viral copies this assay can detect is 138 copies/mL. A negative result does not preclude SARS-Cov-2 infection and should not be used as the sole basis for treatment or other patient management decisions. A negative result may occur with  improper specimen collection/handling, submission of specimen other than nasopharyngeal swab, presence of  viral mutation(s) within the areas targeted by this assay, and inadequate number of viral copies(<138 copies/mL). A negative result must be combined with clinical observations, patient history, and epidemiological information. The expected result is Negative.  Fact Sheet for Patients:  BloggerCourse.com  Fact Sheet for Healthcare Providers:  SeriousBroker.it  This test is no t yet approved or cleared by the [place] FDA and  has been authorized for detection and/or diagnosis of SARS-CoV-2 by FDA under an Emergency Use Authorization (EUA). This EUA will remain  in effect (meaning this test can be used) for the duration of the COVID-19 declaration under Section 564(b)(1) of the Act, 21 U.S.C.section 360bbb-3(b)(1),  unless the authorization is terminated  or revoked sooner.       Influenza A by PCR NEGATIVE NEGATIVE Final   Influenza B by PCR NEGATIVE NEGATIVE Final    Comment: (NOTE) The Xpert Xpress SARS-CoV-2/FLU/RSV plus assay is intended as an aid in the diagnosis of influenza from Nasopharyngeal swab specimens and should not be used as a sole basis for treatment. Nasal washings and aspirates are unacceptable for Xpert Xpress SARS-CoV-2/FLU/RSV testing.  Fact Sheet for Patients: BloggerCourse.com  Fact Sheet for Healthcare Providers: SeriousBroker.it  This test is not yet approved or cleared by the [place] FDA and has been authorized for detection and/or diagnosis of SARS-CoV-2 by FDA under an Emergency Use Authorization (EUA). This EUA will remain in effect (meaning this test can be used) for the duration of the COVID-19 declaration under Section 564(b)(1) of the Act, 21 U.S.C. section 360bbb-3(b)(1), unless the authorization is terminated or revoked.  Performed at [facility], [address] [place], [place] [address]       Radiology  Studies: No results found.  Pamella Pert, MD, PhD [facility]  Between 7 am - 7 pm I am available, please contact me via Amion (for emergencies) or Securechat (non urgent messages)  Between 7 pm - 7 am I am not available, please contact night coverage MD/APP via Amion

## 2021-02-21 NOTE — Progress Notes (Signed)
GI note  Patient has undergone cholecystectomy.  Continues to improve with the pancreatitis.  No further GI input at this point of time  I will sign off.  Please call me if any further GI concerns or questions.  We would like to thank you for the opportunity to participate in the care of Linda Clark.    Dr Wyline Mood MD,MRCP [facility]) Gastroenterology/Hepatology Pager: [phone]

## 2021-02-21 NOTE — Progress Notes (Signed)
[facility] SURGICAL PROGRESS NOTE  Hospital Day(s): 3.   Post op day(s): 1 Day Post-Op.   Interval History: Patient seen and examined, family at bedside, interpreter service utilized, no acute events or new complaints overnight. Patient reports limited tolerance of clear liquid diet currently.   Vital signs in last 24 hours: [min-max] current  Temp:  [98 F (36.7 C)-99.2 F (37.3 C)] 98.2 F (36.8 C) (11/27 0726) Pulse Rate:  [77-106] 77 (11/27 0726) Resp:  [15-20] 18 (11/27 0726) BP: (104-150)/(53-89) 104/53 (11/27 0726) SpO2:  [93 %-97 %] 95 % (11/27 0726)     Height: 5\' 2"  (157.5 cm) Weight: 77.7 kg BMI (Calculated): 31.32   Intake/Output last 2 shifts:  11/26 0701 - 11/27 0700 In: 1684.8 [I.V.:1602.5; IV Piggyback:82.3] Out: 210 [Drains:190; Blood:20]   Physical Exam:  Constitutional: alert, cooperative and no distress  Respiratory: breathing non-labored at rest  Cardiovascular: regular rate and sinus rhythm  Gastrointestinal: soft, non-tender, and non-distended, drain present with serosanguineous fluid. Integumentary: No evidence of rash, or acute changes.  Labs:  CBC Latest Ref Rng & Units 02/19/2021 02/18/2021 02/17/2021  WBC 4.0 - 10.5 K/uL 14.0(H) 9.6 8.9  Hemoglobin 12.0 - 15.0 g/dL 15.6(H) 16.3(H) 16.0(H)  Hematocrit 36.0 - 46.0 % 48.0(H) 50.5(H) 49.6(H)  Platelets 150 - 400 K/uL 291 341 301   CMP Latest Ref Rng & Units 02/20/2021 02/19/2021 02/18/2021  Glucose 70 - 99 mg/dL 02/20/2021) 643(P) 295(J)  BUN 6 - 20 mg/dL 15 18 15   Creatinine 0.44 - 1.00 mg/dL 884(Z ) 6.60  Sodium 135 - 145 mmol/L 138 138 138  Potassium 3.5 - 5.1 mmol/L 3.3(L) 3.3(L) 4.3  Chloride 98 - 111 mmol/L 102 103 102  CO2 22 - 32 mmol/L 29 27 28   Calcium 8.9 - 10.3 mg/dL 6.30(Z) 6.01) 9.3  Total Protein 6.5 - 8.1 g/dL 7.0 7.0 )  Total Bilirubin 0.3 - 1.2 mg/dL 0.9(N) 2.1(H) 3.4(H)  Alkaline Phos 38 - 126 U/L 58 82 85  AST 15 - 41 U/L 48(H) 225(H) 367(H)  ALT 0  - 44 U/L 151(H) 301(H) 296(H)     Imaging studies: No new pertinent imaging studies   Assessment/Plan:  60 y.o. female with  1 Day Post-Op s/p robotic cholecystectomy for gallstone pancreatitis and acute cholecystitis cholelithiasis., complicated by pertinent comorbidities including :  Patient Active Problem List   Diagnosis Date Noted   Acute pancreatitis 02/18/2021   Abdominal pain 02/18/2021   Nausea & vomiting 02/18/2021   Elevated lipase 02/18/2021   Hyperglycemia 02/18/2021   Transaminitis 02/18/2021   Biliary sludge determined by ultrasound 02/18/2021   Hepatic steatosis 02/18/2021   Hypertensive urgency 02/18/2021   Obesity (BMI 30.0-34.9) 02/18/2021   Essential hypertension 02/18/2021   Mixed hyperlipidemia 02/18/2021   CAD (coronary artery disease) 02/18/2021   Dehydration 02/18/2021    -Continue antibiotics for 7 to 10 days of complete course.  -Diet diet as tolerated.  -Continue drain to bulb suction.  -We will follow-up.  All of the above findings and recommendations were discussed with the patient, patient's family, and the medical team, and all of patient's and family's questions were answered to their expressed satisfaction.  -- 02/20/2021, M.D., [facility] 02/21/2021

## 2021-02-22 ENCOUNTER — Inpatient Hospital Stay: Payer: Self-pay

## 2021-02-22 DIAGNOSIS — R7989 Other specified abnormal findings of blood chemistry: Secondary | ICD-10-CM

## 2021-02-22 LAB — CBC
HCT: 42 % (ref 36.0–46.0)
Hemoglobin: 13.6 g/dL (ref 12.0–15.0)
MCH: 30.2 pg (ref 26.0–34.0)
MCHC: 32.4 g/dL (ref 30.0–36.0)
MCV: 93.3 fL (ref 80.0–100.0)
Platelets: 227 10*3/uL (ref 150–400)
RBC: 4.5 MIL/uL (ref 3.87–5.11)
RDW: 13.2 % (ref 11.5–15.5)
WBC: 9.3 10*3/uL (ref 4.0–10.5)
nRBC: 0 % (ref 0.0–0.2)

## 2021-02-22 LAB — COMPREHENSIVE METABOLIC PANEL
ALT: 213 U/L — ABNORMAL HIGH (ref 0–44)
AST: 262 U/L — ABNORMAL HIGH (ref 15–41)
Albumin: 2.9 g/dL — ABNORMAL LOW (ref 3.5–5.0)
Alkaline Phosphatase: 130 U/L — ABNORMAL HIGH (ref 38–126)
Anion gap: 4 — ABNORMAL LOW (ref 5–15)
BUN: 14 mg/dL (ref 6–20)
CO2: 31 mmol/L (ref 22–32)
Calcium: 7.8 mg/dL — ABNORMAL LOW (ref 8.9–10.3)
Chloride: 103 mmol/L (ref 98–111)
Creatinine, Ser: 0.36 mg/dL — ABNORMAL LOW (ref 0.44–1.00)
GFR, Estimated: >60 mL/min (ref >60)
Glucose, Bld: 137 mg/dL — ABNORMAL HIGH (ref 70–99)
Potassium: 3.9 mmol/L (ref 3.5–5.1)
Sodium: 138 mmol/L (ref 135–145)
Total Bilirubin: 2.3 mg/dL — ABNORMAL HIGH (ref 0.3–1.2)
Total Protein: 6.1 g/dL — ABNORMAL LOW (ref 6.5–8.1)

## 2021-02-22 LAB — LIPASE, BLOOD: Lipase: 589 U/L — ABNORMAL HIGH (ref 11–51)

## 2021-02-22 IMAGING — MR MR ABDOMEN WO/W CM MRCP
19 of 20 series · 45 of 48 positions shown · IV contrast (gadavist)
Comparison: [DATE]

CLINICAL DATA: Abdominal pain. Two days status post
cholecystectomy. Acute pancreatitis.

EXAM:
MRI ABDOMEN WITHOUT AND WITH CONTRAST (INCLUDING MRCP)
TECHNIQUE: Multiplanar multisequence MR imaging of the abdomen was performed
both before and after the administration of intravenous contrast.
Heavily T2-weighted images of the biliary and pancreatic ducts were
obtained, and three-dimensional MRCP images were rendered by post
processing.
CONTRAST:  7mL GADAVIST GADOBUTROL 1 MMOL/ML IV SOLN

[Series 3: T2 · coronal · 6.0mm · 1.19mm/px · 1 of 35 slices shown (1 of 2)]
[im 1/35]
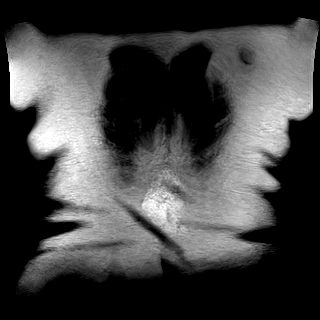

[Series 4: T2 · axial · 6.0mm · 1.19mm/px · 1 of 39 slices shown (2 of 2)]
[im 1/39]
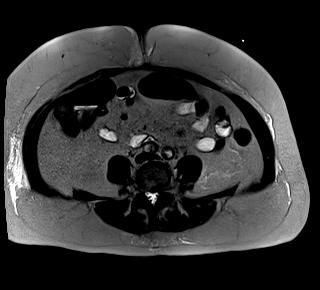

[Series 5: T1 · axial · 3.0mm · 1.19mm/px · z∈[-89,+196]mm · 2 of 96 slices shown (1 of 2)]
[im 1/96]
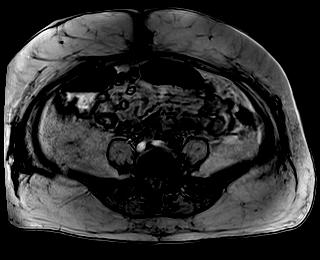
[im 96/96]
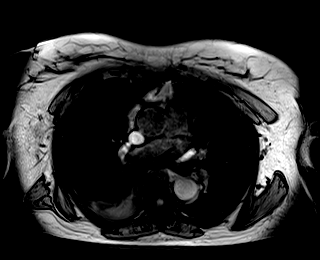

[Series 6: T1 · axial · 3.0mm · 1.19mm/px · z∈[-89,+196]mm · 3 of 96 slices shown (2 of 2)]
[im 1/96]
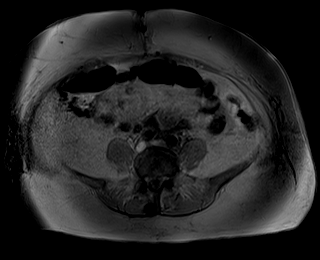
[im 48/96]
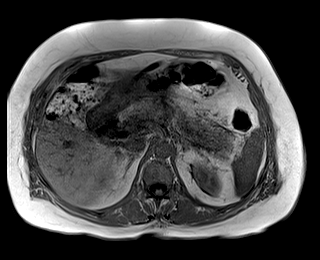
[im 96/96]
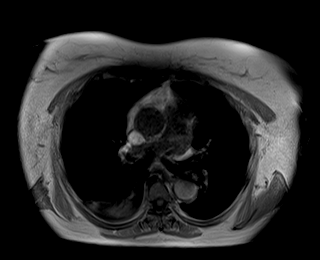

[Series 7: T2 fat-sat · axial · 6.0mm · 1.19mm/px · 1 of 39 slices shown]
[im 1/39]
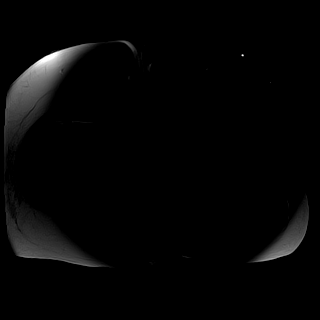

[Series 8: ax dwi_tracew · axial · 6.0mm · 1.42mm/px · z∈[-82,+198]mm · 4 of 120 slices shown]
[im 1/120]
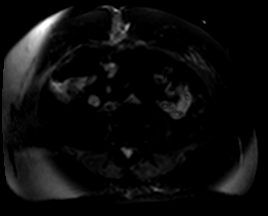
[im 40/120]
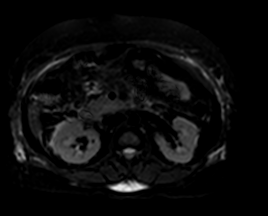
[im 80/120]
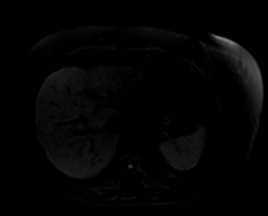
[im 120/120]
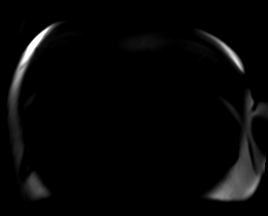

[Series 9: ax dwi_adc · axial · 6.0mm · 1.42mm/px · 1 of 40 slices shown]
[im 1/40]
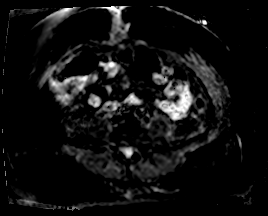

[Series 13: MRCP · coronal · 3.0mm · 1.12mm/px · 1 of 21 slices shown]
[im 1/21]
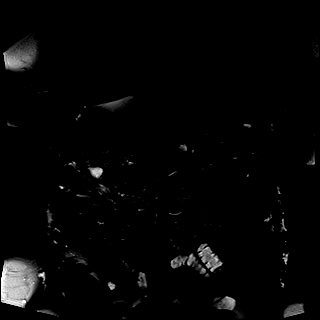

[Series 14: radials · coronal · 50.0mm · 0.78mm/px · 1 of 5 slices shown]
[im 1/5]
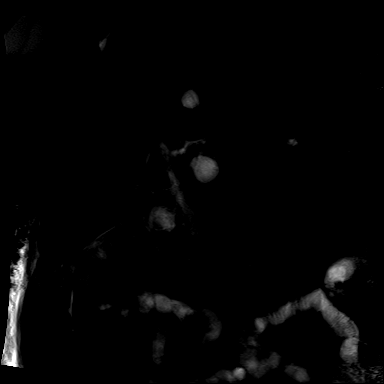

[Series 15: T1 dynamic fat-sat · axial · non-contrast · 3.0mm · 1.19mm/px · z∈[-77,+184]mm · 3 of 88 slices shown (1 of 5)]
[im 1/88]
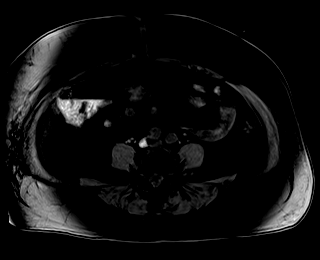
[im 44/88]
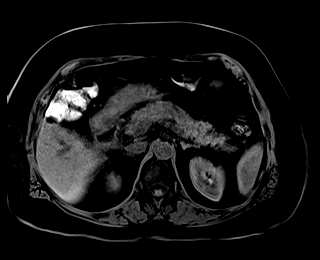
[im 88/88]
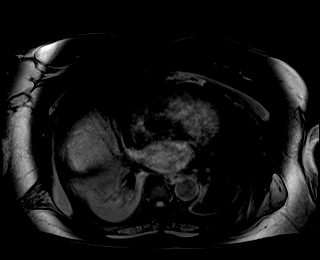

[Series 16: T1 dynamic fat-sat post-contrast · axial · 3.0mm · 1.19mm/px · z∈[-77,+184]mm · 3 of 88 slices shown (1 of 4)]
[im 1/88]
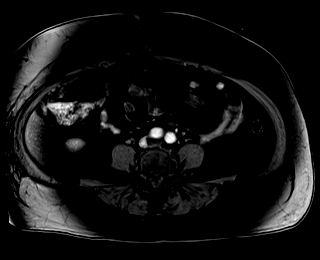
[im 44/88]
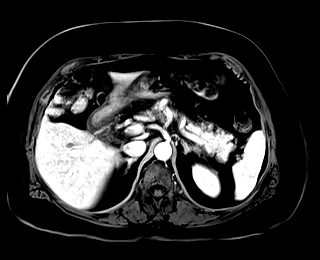
[im 88/88]
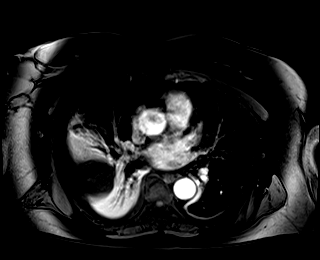

[Series 17: T1 dynamic fat-sat · axial · 3.0mm · 1.19mm/px · z∈[-77,+184]mm · 3 of 88 slices shown (2 of 5)]
[im 1/88]
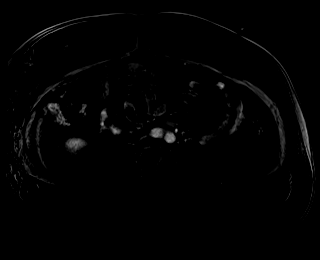
[im 44/88]
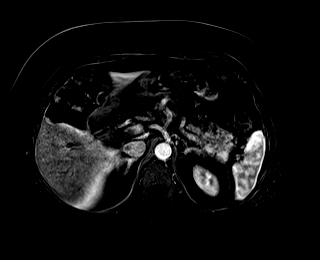
[im 88/88]
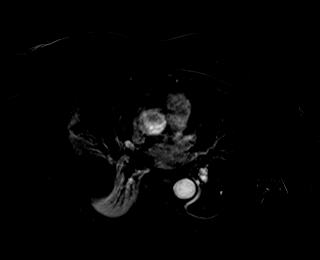

[Series 18: T1 dynamic fat-sat post-contrast · axial · 3.0mm · 1.19mm/px · z∈[-77,+184]mm · 3 of 88 slices shown (2 of 4)]
[im 1/88]
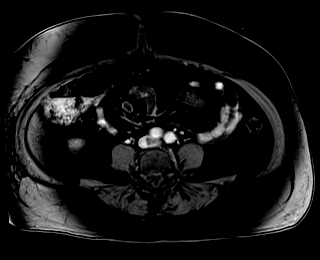
[im 44/88]
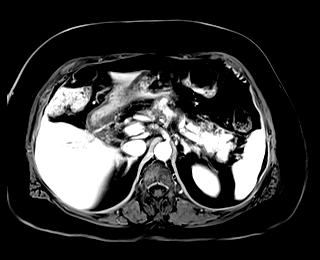
[im 88/88]
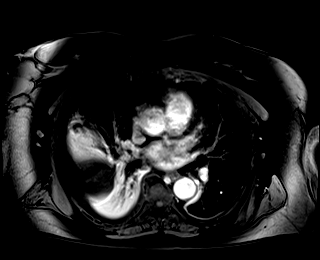

[Series 19: T1 dynamic fat-sat · axial · 3.0mm · 1.19mm/px · z∈[-77,+184]mm · 3 of 88 slices shown (3 of 5)]
[im 1/88]
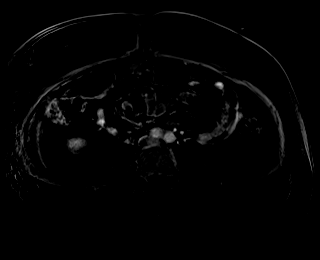
[im 44/88]
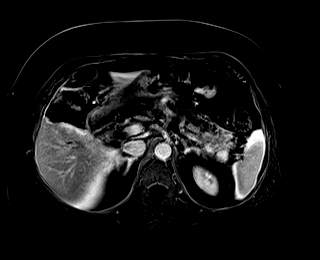
[im 88/88]
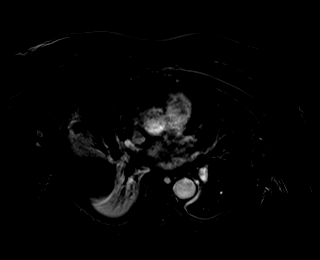

[Series 20: T1 dynamic fat-sat post-contrast · axial · 3.0mm · 1.19mm/px · z∈[-77,+184]mm · 3 of 88 slices shown (3 of 4)]
[im 1/88]
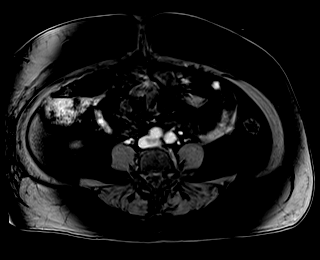
[im 44/88]
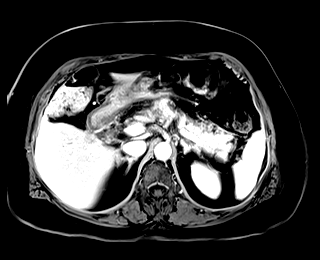
[im 88/88]
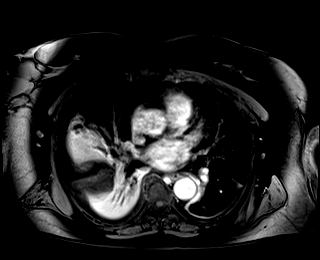

[Series 21: T1 dynamic fat-sat · axial · 3.0mm · 1.19mm/px · z∈[-77,+184]mm · 3 of 88 slices shown (4 of 5)]
[im 1/88]
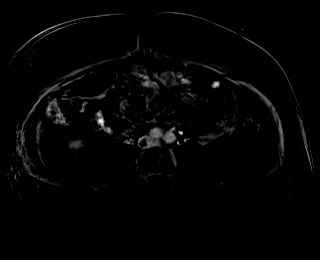
[im 44/88]
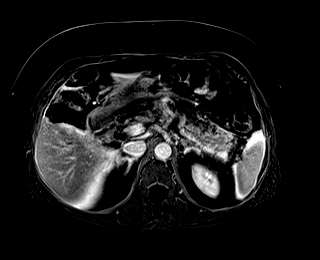
[im 88/88]
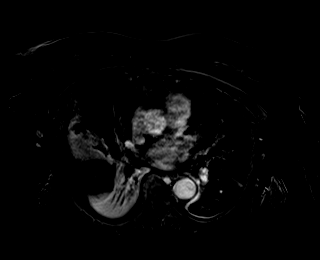

[Series 22: T1 dynamic post-contrast · coronal · 3.0mm · 1.31mm/px · 3 of 88 slices shown]
[im 1/88]
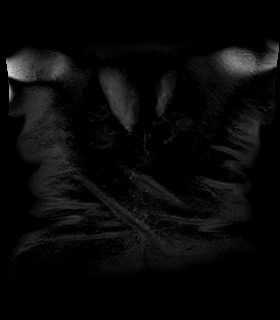
[im 44/88]
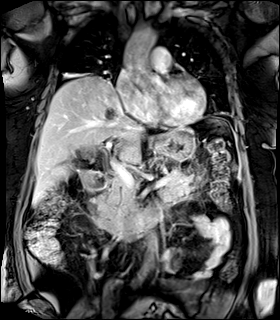
[im 88/88]
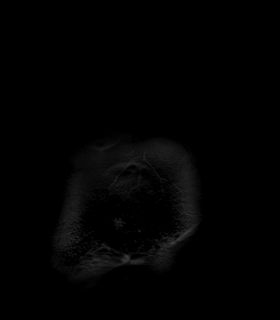

[Series 23: T1 dynamic fat-sat post-contrast · axial · 3.0mm · 1.19mm/px · z∈[-77,+184]mm · 3 of 88 slices shown (4 of 4)]
[im 1/88]
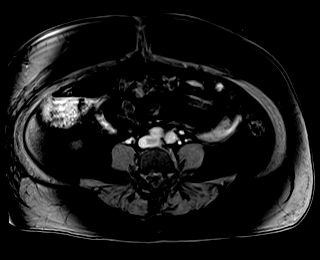
[im 44/88]
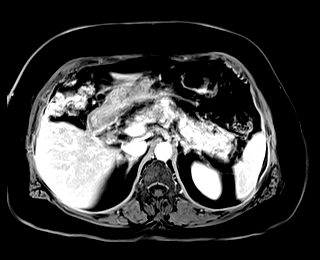
[im 88/88]
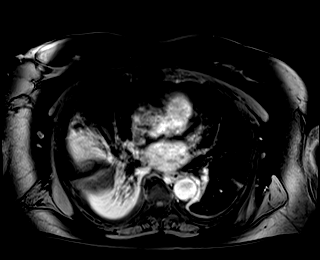

[Series 24: T1 dynamic fat-sat · axial · 3.0mm · 1.19mm/px · z∈[-77,+184]mm · 3 of 88 slices shown (5 of 5)]
[im 1/88]
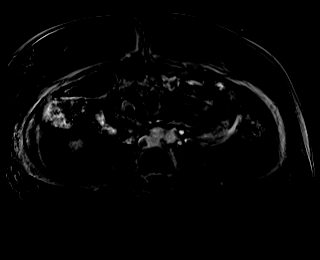
[im 44/88]
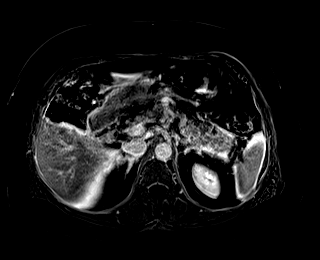
[im 88/88]
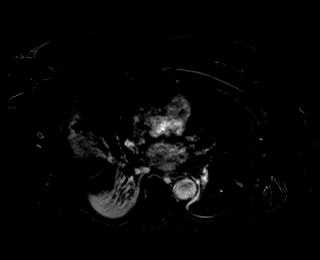

[45 of 48 positions shown; findings below may reference images not displayed]

FINDINGS: Lower chest: New small bilateral pleural effusions and bibasilar
atelectasis.

Hepatobiliary: Multiple small hepatic cysts are again seen. No
hepatic masses identified. Patient has undergone cholecystectomy
since prior study. No evidence of biliary ductal dilatation or
choledocholithiasis. No abnormal fluid collections seen within the
abdomen.

Pancreas: Diffuse pancreatic edema and peripancreatic fluid show
improvement since previous study, consistent with improving acute
pancreatitis. No evidence of pancreatic mass, necrosis, or
pseudocyst. No evidence of pancreatic ductal dilatation or pancreas
divisum.

Spleen:  Within normal limits in size and appearance.

Adrenals/Urinary Tract: No masses identified. A few simple renal
cysts are noted bilaterally. A 1.3 cm Bosniak category 2 hemorrhagic
cyst is also seen in the lower pole of the right kidney. No evidence
of renal mass or hydronephrosis.

Stomach/Bowel: Visualized portion unremarkable.

Vascular/Lymphatic: No pathologically enlarged lymph nodes
identified. No acute vascular findings.

Other:  None.

Musculoskeletal:  No suspicious bone lesions identified.
IMPRESSION: Status post cholecystectomy. No evidence of biliary ductal
dilatation or choledocholithiasis.

Improved acute pancreatitis. No evidence of pancreatic necrosis,
pseudocyst, or mass.

New small bilateral pleural effusions and bibasilar atelectasis.

## 2021-02-22 MED ORDER — AMLODIPINE BESYLATE 10 MG PO TABS
10.0000 mg | ORAL_TABLET | Freq: Every day | ORAL | Status: DC
  Administered 2021-02-22 – 2021-02-25 (×4): 10 mg via ORAL
  Filled 2021-02-22 (×4): qty 1

## 2021-02-22 MED ORDER — POLYETHYLENE GLYCOL 3350 17 G PO PACK
17.0000 g | PACK | Freq: Two times a day (BID) | ORAL | Status: DC
  Administered 2021-02-22 – 2021-02-24 (×3): 17 g via ORAL
  Filled 2021-02-22 (×5): qty 1

## 2021-02-22 MED ORDER — MORPHINE SULFATE (PF) 2 MG/ML IV SOLN
2.0000 mg | INTRAVENOUS | Status: DC | PRN
  Administered 2021-02-22: 06:00:00 2 mg via INTRAVENOUS
  Filled 2021-02-22: qty 1

## 2021-02-22 MED ORDER — HYDROMORPHONE HCL 1 MG/ML IJ SOLN
0.5000 mg | INTRAMUSCULAR | Status: DC | PRN
Start: 2021-02-22 — End: 2021-02-23
  Administered 2021-02-22: 16:00:00 0.5 mg via INTRAVENOUS
  Administered 2021-02-22: 10:00:00 1 mg via INTRAVENOUS
  Filled 2021-02-22 (×3): qty 1

## 2021-02-22 MED ORDER — ENSURE ENLIVE PO LIQD: 237.0000 mL | Freq: Two times a day (BID) | ORAL | Status: DC

## 2021-02-22 MED ORDER — GADOBUTROL 1 MMOL/ML IV SOLN
7.0000 mL | Freq: Once | INTRAVENOUS | Status: AC | PRN
  Administered 2021-02-22: 13:00:00 7 mL via INTRAVENOUS

## 2021-02-22 MED ORDER — LABETALOL HCL 5 MG/ML IV SOLN
20.0000 mg | INTRAVENOUS | Status: DC | PRN
  Filled 2021-02-22: qty 4

## 2021-02-22 MED ORDER — CARVEDILOL 25 MG PO TABS
25.0000 mg | ORAL_TABLET | Freq: Two times a day (BID) | ORAL | Status: DC
  Administered 2021-02-22 – 2021-02-25 (×6): 25 mg via ORAL
  Filled 2021-02-22 (×6): qty 1

## 2021-02-22 MED ORDER — SODIUM CHLORIDE 0.9 % IV SOLN: INTRAVENOUS | Status: DC

## 2021-02-22 MED ORDER — OXYCODONE HCL 5 MG PO TABS
5.0000 mg | ORAL_TABLET | ORAL | Status: DC | PRN
Start: 2021-02-22 — End: 2021-02-25
  Administered 2021-02-22 (×2): 5 mg via ORAL
  Administered 2021-02-23 (×2): 10 mg via ORAL
  Administered 2021-02-24: 22:00:00 5 mg via ORAL
  Administered 2021-02-24 (×2): 10 mg via ORAL
  Administered 2021-02-25: 02:00:00 5 mg via ORAL
  Filled 2021-02-22 (×2): qty 2
  Filled 2021-02-22 (×2): qty 1
  Filled 2021-02-22: qty 2
  Filled 2021-02-22 (×2): qty 1
  Filled 2021-02-22: qty 2
  Filled 2021-02-22: qty 1

## 2021-02-22 NOTE — Progress Notes (Signed)
PROGRESS NOTE  Linda Clark WOE:[id number] DOB: 01-Feb-1961 DOA: 02/18/2021 PCP: Preston Fleeting, MD   LOS: 4 days   Brief Narrative / Interim history: 60 year old female with history of HTN, HLD, GERD, comes into the hospital with abdominal pain associated with vomiting.  Pain was mainly epigastric area radiating to the back.  She was found to have acute pancreatitis with a significantly elevated lipase.  Right upper quadrant ultrasound showed gallbladder sludge, underwent an MRCP which showed moderate to severe acute pancreatitis and dependent material in the gallbladder likely representing sludge and possible small stones.  CBD was mildly dilated but no choledocholithiasis was seen.  GI and general surgery was consulted and she was admitted to the hospital.  Subjective / 24h Interval events: She is feeling worse today, had severe overnight pain.  There was apparently leak around her drain overnight but that is now resolved.  Interpreter iPad used for interview  Assessment & Plan: Principal Problem Acute pancreatitis, likely gallstone related -possibly she passed a gallstone given CBD dilation but there is no current choledocholithiasis noticed on the MRCP.  Gastroenterology and general surgery consulted.  Patient was taken to the OR on 11/26 found to have severe acute cholecystitis with gallbladder empyema. -Currently on Zosyn, continue.  She has a drain in place, 160 cc output overnight  Active Problems Hypertensive urgency, essential hypertension-blood pressure on the high side, resume home medications today at full dose  Elevated LFTs-due to #1  Hypokalemia-continue to replace and monitor, normalized this morning  Hyperlipidemia-hold statin, at home she is on atorvastatin and Zetia.  Monitor LFTs  Obesity, class I-BMI 31, she would benefit from weight loss  GERD-on IV PPI  Scheduled Meds:  (feeding supplement) PROSource Plus  30 mL Oral TID BM    amLODipine  10 mg Oral Daily   carvedilol  25 mg Oral BID WC   Chlorhexidine Gluconate Cloth  6 each Topical Q0600   enoxaparin (LOVENOX) injection  40 mg Subcutaneous Q24H   feeding supplement  1 Container Oral TID BM   HYDROmorphone HCl  0.5 mg Oral Once   multivitamin with minerals  1 tablet Oral Daily   pantoprazole (PROTONIX) IV  40 mg Intravenous Q12H   Continuous Infusions:  lactated ringers 75 mL/hr at 02/22/21 0942   piperacillin-tazobactam (ZOSYN)  IV 3.375 g (02/22/21 0944)   PRN Meds:.HYDROmorphone (DILAUDID) injection, labetalol, ondansetron (ZOFRAN) IV, oxyCODONE, simethicone  Diet Orders (From admission, onward)     Start     Ordered   02/21/21 1448  Diet regular Room service appropriate? Yes; Fluid consistency: Thin  Diet effective now       Question Answer Comment  Room service appropriate? Yes   Fluid consistency: Thin      02/21/21 1447           DVT prophylaxis: enoxaparin (LOVENOX) injection 40 mg Start: 02/18/21 1745 SCDs Start: 02/18/21 1732    Code Status: Full Code  Family Communication: No family at bedside  Status is: Inpatient  Remains inpatient appropriate because: Severity of illness, drain in place, antibiotics  Level of care: Med-Surg  Consultants:  General surgery GI  Procedures:  Laparoscopic cholecystectomy 11/26  Microbiology  none  Antimicrobials: Ceftriaxone 11/25-11/26 Zosyn 11/26 >>   Objective: Vitals:   02/21/21 2010 02/22/21 0028 02/22/21 0549 02/22/21 0752  BP: (!) 148/88 106/70 (!) 162/98 (!) 159/109  Pulse: 82 80 92 82  Resp: 16 16 20 16   Temp: 98 F (36.7 C)  99.5 F (37.5 C) 99.1 F (37.3 C) 98 F (36.7 C)  TempSrc: Oral Oral Oral Oral  SpO2: 91% 92% 95% 96%  Weight:      Height:        Intake/Output Summary (Last 24 hours) at 02/22/2021 1014 Last data filed at 02/22/2021 0600 Gross per 24 hour  Intake 1551.56 ml  Output 160 ml  Net 1391.56 ml    Filed Weights   02/18/21 0953  Weight:  77.7 kg    Examination:  Constitutional: NAD Eyes: No scleral icterus ENMT: mmm Neck: normal, supple Respiratory: CTA bilaterally, no wheezing or crackles Cardiovascular: Regular rate and rhythm, no murmurs, no edema Abdomen: Diffusely tender to palpation without guarding, drain in place, no leakage around the dressing noted Musculoskeletal: no clubbing / cyanosis.  Skin: No rashes seen Neurologic: Nonfocal, equal strength  Data Reviewed: I have independently reviewed following labs and imaging studies   CBC: Recent Labs  Lab 02/17/21 1209 02/18/21 1005 02/19/21 0416 02/21/21 1225 02/22/21 0436  WBC 8.9 9.6 14.0* 11.3* 9.3  NEUTROABS 6.4  --   --   --   --   HGB 16.0* 16.3* 15.6* 12.5 13.6  HCT 49.6* 50.5* 48.0* 39.0 42.0  MCV 92.5 93.5 92.8 93.5 93.3  PLT 301 341 291 244 227    Basic Metabolic Panel: Recent Labs  Lab 02/18/21 1005 02/19/21 0416 02/20/21 0415 02/21/21 1225 02/22/21 0436  NA 138 138 138 138 138  K 4.3 3.3* 3.3* 3.3* 3.9  CL 102 103 102 103 103  CO2 28 27 29 30 31   GLUCOSE 156* 122* 109* 133* 137*  BUN 15 18 15 16 14   CREATININE 0.65 0.43* 0.49 0.40* 0.36*  CALCIUM 9.3 8.8* 8.5* 8.1* 7.8*  MG  --  2.4  --   --   --   PHOS  --  3.6  --   --   --     Liver Function Tests: Recent Labs  Lab 02/18/21 1005 02/19/21 0416 02/20/21 0415 02/21/21 1225 02/22/21 0436  AST 367* 225* 48* 43* 262*  ALT 296* 301* 151* 99* 213*  ALKPHOS 85 82 58 45 130*  BILITOT 3.4* 2.1* 1.5* 0.8 2.3*  PROT 8.3* 7.0 7.0 6.2* 6.1*  ALBUMIN 4.6 3.9 3.9 3.0* 2.9*    Coagulation Profile: Recent Labs  Lab 02/17/21 1321  INR 0.9    HbA1C: No results for input(s): HGBA1C in the last 72 hours. CBG: No results for input(s): GLUCAP in the last 168 hours.  Recent Results (from the past 240 hour(s))  Resp Panel by RT-PCR (Flu A&B, Covid) Nasopharyngeal Swab     Status: None   Collection Time: 02/17/21  1:21 PM   Specimen: Nasopharyngeal Swab; Nasopharyngeal(NP)  swabs in vial transport medium  Result Value Ref Range Status   SARS Coronavirus 2 by RT PCR NEGATIVE NEGATIVE Final    Comment: (NOTE) SARS-CoV-2 target nucleic acids are NOT DETECTED.  The SARS-CoV-2 RNA is generally detectable in upper respiratory specimens during the acute phase of infection. The lowest concentration of SARS-CoV-2 viral copies this assay can detect is 138 copies/mL. A negative result does not preclude SARS-Cov-2 infection and should not be used as the sole basis for treatment or other patient management decisions. A negative result may occur with  improper specimen collection/handling, submission of specimen other than nasopharyngeal swab, presence of viral mutation(s) within the areas targeted by this assay, and inadequate number of viral copies(<138 copies/mL). A negative result must be combined  with clinical observations, patient history, and epidemiological information. The expected result is Negative.  Fact Sheet for Patients:  BloggerCourse.com  Fact Sheet for Healthcare Providers:  SeriousBroker.it  This test is no t yet approved or cleared by the [place] FDA and  has been authorized for detection and/or diagnosis of SARS-CoV-2 by FDA under an Emergency Use Authorization (EUA). This EUA will remain  in effect (meaning this test can be used) for the duration of the COVID-19 declaration under Section 564(b)(1) of the Act, 21 U.S.C.section 360bbb-3(b)(1), unless the authorization is terminated  or revoked sooner.       Influenza A by PCR NEGATIVE NEGATIVE Final   Influenza B by PCR NEGATIVE NEGATIVE Final    Comment: (NOTE) The Xpert Xpress SARS-CoV-2/FLU/RSV plus assay is intended as an aid in the diagnosis of influenza from Nasopharyngeal swab specimens and should not be used as a sole basis for treatment. Nasal washings and aspirates are unacceptable for Xpert Xpress  SARS-CoV-2/FLU/RSV testing.  Fact Sheet for Patients: BloggerCourse.com  Fact Sheet for Healthcare Providers: SeriousBroker.it  This test is not yet approved or cleared by the [place] FDA and has been authorized for detection and/or diagnosis of SARS-CoV-2 by FDA under an Emergency Use Authorization (EUA). This EUA will remain in effect (meaning this test can be used) for the duration of the COVID-19 declaration under Section 564(b)(1) of the Act, 21 U.S.C. section 360bbb-3(b)(1), unless the authorization is terminated or revoked.  Performed at [facility], [address] [place], [place] [address]   CSF culture w Gram Stain     Status: None   Collection Time: 02/17/21  3:11 PM   Specimen: CSF; Cerebrospinal Fluid  Result Value Ref Range Status   Specimen Description   Final    CSF Performed at [facility], [address], [place], [place] [address]    Special Requests   Final    NONE Performed at [facility], [address] [place], [place] [address]    Gram Stain   Final    CYTOSPIN SLIDE WBC SEEN NO RBC SEEN NO ORGANISMS SEEN Performed at [facility], [address], [place], [place] [address]    Culture   Final    NO GROWTH Performed at [facility], [address] [place], [place] [address]    Report Status 02/21/2021 FINAL  Final  Resp Panel by RT-PCR (Flu A&B, Covid) Nasopharyngeal Swab     Status: None   Collection Time: 02/18/21 12:10 PM   Specimen: Nasopharyngeal Swab; Nasopharyngeal(NP) swabs in vial transport medium  Result Value Ref Range Status   SARS Coronavirus 2 by RT PCR NEGATIVE NEGATIVE Final    Comment: (NOTE) SARS-CoV-2 target nucleic acids are NOT DETECTED.  The SARS-CoV-2 RNA is generally detectable in upper respiratory specimens during the acute phase of infection. The lowest concentration of SARS-CoV-2 viral copies this assay can  detect is 138 copies/mL. A negative result does not preclude SARS-Cov-2 infection and should not be used as the sole basis for treatment or other patient management decisions. A negative result may occur with  improper specimen collection/handling, submission of specimen other than nasopharyngeal swab, presence of viral mutation(s) within the areas targeted by this assay, and inadequate number of viral copies(<138 copies/mL). A negative result must be combined with clinical observations, patient history, and epidemiological information. The expected result is Negative.  Fact Sheet for Patients:  BloggerCourse.com  Fact Sheet for Healthcare Providers:  SeriousBroker.it  This test is no t yet approved or cleared by the [place] and  has been authorized for detection and/or diagnosis of SARS-CoV-2 by FDA under an Emergency Use Authorization (EUA). This EUA will remain  in effect (meaning this test can be used) for the duration of the COVID-19 declaration under Section 564(b)(1) of the Act, 21 U.S.C.section 360bbb-3(b)(1), unless the authorization is terminated  or revoked sooner.       Influenza A by PCR NEGATIVE NEGATIVE Final   Influenza B by PCR NEGATIVE NEGATIVE Final    Comment: (NOTE) The Xpert Xpress SARS-CoV-2/FLU/RSV plus assay is intended as an aid in the diagnosis of influenza from Nasopharyngeal swab specimens and should not be used as a sole basis for treatment. Nasal washings and aspirates are unacceptable for Xpert Xpress SARS-CoV-2/FLU/RSV testing.  Fact Sheet for Patients: BloggerCourse.com  Fact Sheet for Healthcare Providers: SeriousBroker.it  This test is not yet approved or cleared by the [place] FDA and has been authorized for detection and/or diagnosis of SARS-CoV-2 by FDA under an Emergency Use Authorization (EUA). This EUA will remain in  effect (meaning this test can be used) for the duration of the COVID-19 declaration under Section 564(b)(1) of the Act, 21 U.S.C. section 360bbb-3(b)(1), unless the authorization is terminated or revoked.  Performed at [facility], [address] [place], [place] [address]       Radiology Studies: No results found.  Pamella Pert, MD, PhD [facility]  Between 7 am - 7 pm I am available, please contact me via Amion (for emergencies) or Securechat (non urgent messages)  Between 7 pm - 7 am I am not available, please contact night coverage MD/APP via Amion

## 2021-02-22 NOTE — Progress Notes (Signed)
No further leakage noted at JP site. Notified Dr. Arville Care of uncontrolled pain and elevated BP. New orders for Labetalol and Morphine. Labetalol not stocked in pyxis, awaiting dose from pharmacy.

## 2021-02-22 NOTE — Progress Notes (Addendum)
Nutrition Follow-up  DOCUMENTATION CODES:   Obesity unspecified  INTERVENTION:  -d/c Prosource Plus -d/c Boost Breeze -Ensure Enlive po BID, each supplement provides 350 kcal and 20 grams of protein -Continue MVI with minerals daily  NUTRITION DIAGNOSIS:   Increased nutrient needs related to acute illness (pancreatitis) as evidenced by estimated needs.  ongoing  GOAL:   Patient will meet greater than or equal to 90% of their needs  progressing  MONITOR:   PO intake, Supplement acceptance, Diet advancement, Labs, Weight trends, Skin, I & O's  REASON FOR ASSESSMENT:   Malnutrition Screening Tool    ASSESSMENT:   Linda Clark is a 60 y.o. female with medical history significant for hypertension, hyperlipidemia, CAD and GERD who presents to the emergency department due to abdominal pain and nonbloody vomiting which started yesterday.  Pain was sharp, constant and epigastric with radiation towards middle of her back, it was associated with nausea and several episodes of nonbloody vomiting, no alleviating/aggravating factor known.  She presented to the ED yesterday due to elevated blood pressure and headache and there was concern for subarachnoid hemorrhage, but this was ruled due to negative LP and reassuring CT of head and neck chest and abdomen per ED physician and ED medical record.  Patient was then discharged home, unfortunately, she was unable to tolerate antihypertensive medication due to recurrent vomiting.  She complains of some headache which has improved since arrival to the ED, but denies fever, chills, chest pain, shortness of breath, alcohol use.  Pt admitted with acute pancreatitis and is now s/p Cholecystectomy as of 11/26. Pt was tolerating clear liquids and had diet advanced to regular yesterday, 11/27. GI has signed off. RD to adjust supplement regimen as pt declined Prosource and Boost per RN. Note 1 episode of emesis reported x 24 hours. RD  to monitor for tolerance of diet/supplements. Note pt reporting feeling worse today and is c/o pain.   PO Intake: 100% x 1 recorded meal   UOP: 3x unmeasured occurrences x24 hours JP Drain output: x24 hours I/O: +784ml since admit  Medications: Scheduled Meds:  (feeding supplement) PROSource Plus  30 mL Oral TID BM   amLODipine  10 mg Oral Daily   carvedilol  25 mg Oral BID WC   Chlorhexidine Gluconate Cloth  6 each Topical Q0600   enoxaparin (LOVENOX) injection  40 mg Subcutaneous Q24H   feeding supplement  1 Container Oral TID BM   HYDROmorphone HCl  0.5 mg Oral Once   multivitamin with minerals  1 tablet Oral Daily   pantoprazole (PROTONIX) IV  40 mg Intravenous Q12H  Continuous Infusions:  lactated ringers 75 mL/hr at 02/22/21 0942   piperacillin-tazobactam (ZOSYN)  IV 3.375 g (02/22/21 0944)   Labs: Recent Labs  Lab 02/19/21 0416 02/20/21 0415 02/21/21 1225 02/22/21 0436  NA 138 138 138 138  K 3.3* 3.3* 3.3* 3.9  CL 103 102 103 103  CO2 27 29 30 31   BUN 18 15 16 14   CREATININE 0.43* 0.49 0.40* 0.36*  CALCIUM 8.8* 8.5* 8.1* 7.8*  MG 2.4  --   --   --   PHOS 3.6  --   --   --   GLUCOSE 122* 109* 133* 137*  Elevated LFTs    Diet Order:   Diet Order             Diet regular Room service appropriate? Yes; Fluid consistency: Thin  Diet effective now  EDUCATION NEEDS:   No education needs have been identified at this time  Skin:  Skin Assessment: Skin Integrity Issues: Skin Integrity Issues:: Incisions Incisions: abdomen  Last BM:  11/27  Height:   Ht Readings from Last 1 Encounters:  02/18/21 5\' 2"  (1.575 m)    Weight:   Wt Readings from Last 1 Encounters:  02/18/21 77.7 kg    Ideal Body Weight:  50 kg  BMI:  Body mass index is 31.33 kg/m.  Estimated Nutritional Needs:   Kcal:  1550-1750  Protein:  85-100 grams  Fluid:  > 1.5 L     Rebel Laughridge A., MS, RD, LDN (she/her/hers) RD pager number and  weekend/on-call pager number located in Amion.

## 2021-02-22 NOTE — Progress Notes (Signed)
Pt's BP was 163/102 (120). Upon 10 min re-check, BP was 159/109. Joy, RN notified and Carvedilol given.

## 2021-02-22 NOTE — Progress Notes (Signed)
 [facility]  Hospital Day(s): 4.   Post op day(s): 2 Days Post-Op.   Interval History: Patient seen and examined, family at bedside, no acute events, new complaints overnight include right upper quadrant/epigastric and back pain, with associated nausea.   Vital signs in last 24 hours: [min-max] current  Temp:  [98 F (36.7 C)-99.5 F (37.5 C)] 98.5 F (36.9 C) (11/28 1100) Pulse Rate:  [70-92] 70 (11/28 1100) Resp:  [16-20] 20 (11/28 1100) BP: (106-162)/(68-109) 139/82 (11/28 1100) SpO2:  [91 %-98 %] 96 % (11/28 1100)     Height: 5\' 2"  (157.5 cm) Weight: 77.7 kg BMI (Calculated): 31.32   Intake/Output last 2 shifts:  11/27 0701 - 11/28 0700 In: 1551.6 [I.V.:1426.3; IV Piggyback:125.3] Out: 160 [Drains:160] serosanguineous  Physical Exam:  Constitutional: alert, cooperative and no distress  Respiratory: breathing non-labored at rest  Cardiovascular: regular rate and sinus rhythm  Gastrointestinal: soft, non-tender, and non-distended, drain present with more serous, nonbilious fluid. Integumentary: No evidence of rash, or acute changes.  Labs:  CBC Latest Ref Rng & Units 02/22/2021 02/21/2021 02/19/2021  WBC 4.0 - 10.5 K/uL 9.3 11.3(H) 14.0(H)  Hemoglobin 12.0 - 15.0 g/dL 02/21/2021 16.0 15.6(H)  Hematocrit 36.0 - 46.0 % 42.0 39.0 48.0(H)  Platelets 150 - 400 K/uL 227 244 291   CMP Latest Ref Rng & Units 02/22/2021 02/21/2021 02/20/2021  Glucose 70 - 99 mg/dL 02/22/2021) 106(Y) 694(W)  BUN 6 - 20 mg/dL 14 16 15   Creatinine 0.44 - 1.00 mg/dL 546(E) ) 7.03(J  Sodium 135 - 145 mmol/L 138 138 138  Potassium 3.5 - 5.1 mmol/L 3.9 3.3(L) 3.3(L)  Chloride 98 - 111 mmol/L 103 103 102  CO2 22 - 32 mmol/L 31 30 29   Calcium 8.9 - 10.3 mg/dL 7.8(L) 8.1(L) 8.5(L)  Total Protein 6.5 - 8.1 g/dL 6.1(L) 6.2(L) 7.0  Total Bilirubin 0.3 - 1.2 mg/dL 2.3(H) 0.8 1.5(H)  Alkaline Phos 38 - 126 U/L 130(H) 45 58  AST 15 - 41 U/L 262(H) 43(H) 48(H)  ALT 0 - 44  U/L 213(H) 99(H) 151(H)     Imaging studies: No new pertinent imaging studies, MRCP pending.    Assessment/Plan:  60 y.o. female with  2 Days Post-Op s/p robotic cholecystectomy for gallstone pancreatitis and acute cholecystitis cholelithiasis., complicated by pertinent comorbidities including :  Patient Active Problem List   Diagnosis Date Noted   Acute pancreatitis 02/18/2021   Abdominal pain 02/18/2021   Nausea & vomiting 02/18/2021   Elevated lipase 02/18/2021   Hyperglycemia 02/18/2021   Transaminitis 02/18/2021   Biliary sludge determined by ultrasound 02/18/2021   Hepatic steatosis 02/18/2021   Hypertensive urgency 02/18/2021   Obesity (BMI 30.0-34.9) 02/18/2021   Essential hypertension 02/18/2021   Mixed hyperlipidemia 02/18/2021   CAD (coronary artery disease) 02/18/2021   Dehydration 02/18/2021    -Continue antibiotics for 7 to 10 days to complete course.  -GI alerted regarding, plan for MRCP.  -Continue drain to bulb suction.  -We will follow-up, tracking repeat MRCP results.   All of the above findings and recommendations were discussed with the patient, patient's family, and the medical team, and all of patient's and family's questions were answered to their expressed satisfaction.  -- 02/20/2021, M.D., [facility] 02/22/2021

## 2021-02-22 NOTE — Progress Notes (Signed)
Linda Darby, MD [address]  [place]  [place], [place] [address]  Main: [phone]  Fax: [phone] Pager: [phone]   Subjective: Patient reports feeling somewhat better.  She does report mild epigastric discomfort.  She reports not having bowel movement since admission.  She denies nausea or vomiting.  Patient's daughter and granddaughter are bedside GI has been reconsulted on this patient today due to worsening LFTs  Objective: Vital signs in last 24 hours: Vitals:   02/22/21 0549 02/22/21 0752 02/22/21 1100 02/22/21 1400  BP: (!) 162/98 (!) 159/109 139/82 117/78  Pulse: 92 82 70 69  Resp: 20 16 20 17   Temp: 99.1 F (37.3 C) 98 F (36.7 C) 98.5 F (36.9 C) 98.2 F (36.8 C)  TempSrc: Oral Oral Oral Oral  SpO2: 95% 96% 96% 95%  Weight:      Height:       Weight change:   Intake/Output Summary (Last 24 hours) at 02/22/2021 1601 Last data filed at 02/22/2021 1500 Gross per 24 hour  Intake 1125.82 ml  Output 240 ml  Net 885.82 ml     Exam: Heart:: Regular rate and rhythm, S1S2 present, or without murmur or extra heart sounds Lungs: normal and clear to auscultation Abdomen: Soft, mild epigastric tenderness   Lab Results: CBC Latest Ref Rng & Units 02/22/2021 02/21/2021 02/19/2021  WBC 4.0 - 10.5 K/uL 9.3 11.3(H) 14.0(H)  Hemoglobin 12.0 - 15.0 g/dL 13.6 12.5 15.6(H)  Hematocrit 36.0 - 46.0 % 42.0 39.0 48.0(H)  Platelets 150 - 400 K/uL 227 244 291   CMP Latest Ref Rng & Units 02/22/2021 02/21/2021 02/20/2021  Glucose 70 - 99 mg/dL 137(H) 133(H) 109(H)  BUN 6 - 20 mg/dL 14 16 15   Creatinine 0.44 - 1.00 mg/dL 0.36(L) 0.40(L) 0.49  Sodium 135 - 145 mmol/L 138 138 138  Potassium 3.5 - 5.1 mmol/L 3.9 3.3(L) 3.3(L)  Chloride 98 - 111 mmol/L 103 103 102  CO2 22 - 32 mmol/L 31 30 29   Calcium 8.9 - 10.3 mg/dL 7.8(L) 8.1(L) 8.5(L)  Total Protein 6.5 - 8.1 g/dL 6.1(L) 6.2(L) 7.0  Total Bilirubin 0.3 - 1.2 mg/dL 2.3(H) 0.8 1.5(H)  Alkaline Phos  38 - 126 U/L 130(H) 45 58  AST 15 - 41 U/L 262(H) 43(H) 48(H)  ALT 0 - 44 U/L 213(H) 99(H) 151(H)    Micro Results: Recent Results (from the past 240 hour(s))  Resp Panel by RT-PCR (Flu A&B, Covid) Nasopharyngeal Swab     Status: None   Collection Time: 02/17/21  1:21 PM   Specimen: Nasopharyngeal Swab; Nasopharyngeal(NP) swabs in vial transport medium  Result Value Ref Range Status   SARS Coronavirus 2 by RT PCR NEGATIVE NEGATIVE Final    Comment: (NOTE) SARS-CoV-2 target nucleic acids are NOT DETECTED.  The SARS-CoV-2 RNA is generally detectable in upper respiratory specimens during the acute phase of infection. The lowest concentration of SARS-CoV-2 viral copies this assay can detect is 138 copies/mL. A negative result does not preclude SARS-Cov-2 infection and should not be used as the sole basis for treatment or other patient management decisions. A negative result may occur with  improper specimen collection/handling, submission of specimen other than nasopharyngeal swab, presence of viral mutation(s) within the areas targeted by this assay, and inadequate number of viral copies(<138 copies/mL). A negative result must be combined with clinical observations, patient history, and epidemiological information. The expected result is Negative.  Fact Sheet for Patients:  EntrepreneurPulse.com.au  Fact Sheet for Healthcare Providers:  IncredibleEmployment.be  This test is no t yet approved or cleared by the [place] and  has been authorized for detection and/or diagnosis of SARS-CoV-2 by FDA under an Emergency Use Authorization (EUA). This EUA will remain  in effect (meaning this test can be used) for the duration of the COVID-19 declaration under Section 564(b)(1) of the Act, 21 U.S.C.section 360bbb-3(b)(1), unless the authorization is terminated  or revoked sooner.       Influenza A by PCR NEGATIVE NEGATIVE Final   Influenza B  by PCR NEGATIVE NEGATIVE Final    Comment: (NOTE) The Xpert Xpress SARS-CoV-2/FLU/RSV plus assay is intended as an aid in the diagnosis of influenza from Nasopharyngeal swab specimens and should not be used as a sole basis for treatment. Nasal washings and aspirates are unacceptable for Xpert Xpress SARS-CoV-2/FLU/RSV testing.  Fact Sheet for Patients: EntrepreneurPulse.com.au  Fact Sheet for Healthcare Providers: IncredibleEmployment.be  This test is not yet approved or cleared by the [place] FDA and has been authorized for detection and/or diagnosis of SARS-CoV-2 by FDA under an Emergency Use Authorization (EUA). This EUA will remain in effect (meaning this test can be used) for the duration of the COVID-19 declaration under Section 564(b)(1) of the Act, 21 U.S.C. section 360bbb-3(b)(1), unless the authorization is terminated or revoked.  Performed at [facility], [address] [place], [place] [address]   CSF culture w Gram Stain     Status: None   Collection Time: 02/17/21  3:11 PM   Specimen: CSF; Cerebrospinal Fluid  Result Value Ref Range Status   Specimen Description   Final    CSF Performed at [facility], [address], [place], [place] [address]    Special Requests   Final    NONE Performed at [facility], [place], [place] [address]    Gram Stain   Final    CYTOSPIN SLIDE WBC SEEN NO RBC SEEN NO ORGANISMS SEEN Performed at [facility], [address] [place], [place] [address]    Culture   Final    NO GROWTH Performed at [facility], [place] [address] [place], [place] [address]    Report Status 02/21/2021 FINAL  Final  Resp Panel by RT-PCR (Flu A&B, Covid) Nasopharyngeal Swab     Status: None   Collection Time: 02/18/21 12:10 PM   Specimen: Nasopharyngeal Swab; Nasopharyngeal(NP) swabs in vial transport medium  Result Value Ref Range Status    SARS Coronavirus 2 by RT PCR NEGATIVE NEGATIVE Final    Comment: (NOTE) SARS-CoV-2 target nucleic acids are NOT DETECTED.  The SARS-CoV-2 RNA is generally detectable in upper respiratory specimens during the acute phase of infection. The lowest concentration of SARS-CoV-2 viral copies this assay can detect is 138 copies/mL. A negative result does not preclude SARS-Cov-2 infection and should not be used as the sole basis for treatment or other patient management decisions. A negative result may occur with  improper specimen collection/handling, submission of specimen other than nasopharyngeal swab, presence of viral mutation(s) within the areas targeted by this assay, and inadequate number of viral copies(<138 copies/mL). A negative result must be combined with clinical observations, patient history, and epidemiological information. The expected result is Negative.  Fact Sheet for Patients:  EntrepreneurPulse.com.au  Fact Sheet for Healthcare Providers:  IncredibleEmployment.be  This test is no t yet approved or cleared by the [place] FDA and  has been authorized for detection and/or diagnosis of SARS-CoV-2 by FDA  under an Emergency Use Authorization (EUA). This EUA will remain  in effect (meaning this test can be used) for the duration of the COVID-19 declaration under Section 564(b)(1) of the Act, 21 U.S.C.section 360bbb-3(b)(1), unless the authorization is terminated  or revoked sooner.       Influenza A by PCR NEGATIVE NEGATIVE Final   Influenza B by PCR NEGATIVE NEGATIVE Final    Comment: (NOTE) The Xpert Xpress SARS-CoV-2/FLU/RSV plus assay is intended as an aid in the diagnosis of influenza from Nasopharyngeal swab specimens and should not be used as a sole basis for treatment. Nasal washings and aspirates are unacceptable for Xpert Xpress SARS-CoV-2/FLU/RSV testing.  Fact Sheet for  Patients: EntrepreneurPulse.com.au  Fact Sheet for Healthcare Providers: IncredibleEmployment.be  This test is not yet approved or cleared by the [place] FDA and has been authorized for detection and/or diagnosis of SARS-CoV-2 by FDA under an Emergency Use Authorization (EUA). This EUA will remain in effect (meaning this test can be used) for the duration of the COVID-19 declaration under Section 564(b)(1) of the Act, 21 U.S.C. section 360bbb-3(b)(1), unless the authorization is terminated or revoked.  Performed at [facility], [address] [place], [place] [address]    Studies/Results: MR 3D Recon At Scanner  Result Date: 02/22/2021 CLINICAL DATA:  Abdominal pain. Two days status post cholecystectomy. Acute pancreatitis. EXAM: MRI ABDOMEN WITHOUT AND WITH CONTRAST (INCLUDING MRCP) TECHNIQUE: Multiplanar multisequence MR imaging of the abdomen was performed both before and after the administration of intravenous contrast. Heavily T2-weighted images of the biliary and pancreatic ducts were obtained, and three-dimensional MRCP images were rendered by post processing. CONTRAST:  89mL GADAVIST GADOBUTROL 1 MMOL/ML IV SOLN COMPARISON:  02/18/2021 FINDINGS: Lower chest: New small bilateral pleural effusions and bibasilar atelectasis. Hepatobiliary: Multiple small hepatic cysts are again seen. No hepatic masses identified. Patient has undergone cholecystectomy since prior study. No evidence of biliary ductal dilatation or choledocholithiasis. No abnormal fluid collections seen within the abdomen. Pancreas: Diffuse pancreatic edema and peripancreatic fluid show improvement since previous study, consistent with improving acute pancreatitis. No evidence of pancreatic mass, necrosis, or pseudocyst. No evidence of pancreatic ductal dilatation or pancreas divisum. Spleen:  Within normal limits in size and appearance. Adrenals/Urinary Tract: No masses  identified. A few simple renal cysts are noted bilaterally. A 1.3 cm Bosniak category 2 hemorrhagic cyst is also seen in the lower pole of the right kidney. No evidence of renal mass or hydronephrosis. Stomach/Bowel: Visualized portion unremarkable. Vascular/Lymphatic: No pathologically enlarged lymph nodes identified. No acute vascular findings. Other:  None. Musculoskeletal:  No suspicious bone lesions identified. IMPRESSION: Status post cholecystectomy. No evidence of biliary ductal dilatation or choledocholithiasis. Improved acute pancreatitis. No evidence of pancreatic necrosis, pseudocyst, or mass. New small bilateral pleural effusions and bibasilar atelectasis. Electronically Signed   By: Marlaine Hind M.D.   On: 02/22/2021 14:01   MR ABDOMEN MRCP W WO CONTAST  Result Date: 02/22/2021 CLINICAL DATA:  Abdominal pain. Two days status post cholecystectomy. Acute pancreatitis. EXAM: MRI ABDOMEN WITHOUT AND WITH CONTRAST (INCLUDING MRCP) TECHNIQUE: Multiplanar multisequence MR imaging of the abdomen was performed both before and after the administration of intravenous contrast. Heavily T2-weighted images of the biliary and pancreatic ducts were obtained, and three-dimensional MRCP images were rendered by post processing. CONTRAST:  81mL GADAVIST GADOBUTROL 1 MMOL/ML IV SOLN COMPARISON:  02/18/2021 FINDINGS: Lower chest: New small bilateral pleural effusions and bibasilar atelectasis. Hepatobiliary: Multiple small hepatic cysts are again seen. No hepatic masses identified. Patient  has undergone cholecystectomy since prior study. No evidence of biliary ductal dilatation or choledocholithiasis. No abnormal fluid collections seen within the abdomen. Pancreas: Diffuse pancreatic edema and peripancreatic fluid show improvement since previous study, consistent with improving acute pancreatitis. No evidence of pancreatic mass, necrosis, or pseudocyst. No evidence of pancreatic ductal dilatation or pancreas divisum.  Spleen:  Within normal limits in size and appearance. Adrenals/Urinary Tract: No masses identified. A few simple renal cysts are noted bilaterally. A 1.3 cm Bosniak category 2 hemorrhagic cyst is also seen in the lower pole of the right kidney. No evidence of renal mass or hydronephrosis. Stomach/Bowel: Visualized portion unremarkable. Vascular/Lymphatic: No pathologically enlarged lymph nodes identified. No acute vascular findings. Other:  None. Musculoskeletal:  No suspicious bone lesions identified. IMPRESSION: Status post cholecystectomy. No evidence of biliary ductal dilatation or choledocholithiasis. Improved acute pancreatitis. No evidence of pancreatic necrosis, pseudocyst, or mass. New small bilateral pleural effusions and bibasilar atelectasis. Electronically Signed   By: Marlaine Hind M.D.   On: 02/22/2021 14:01   Medications: I have reviewed the patient's current medications. Prior to Admission:  Medications Prior to Admission  Medication Sig Dispense Refill Last Dose   amLODipine (NORVASC) 10 MG tablet Take 10 mg by mouth daily.      atorvastatin (LIPITOR) 40 MG tablet Take 40 mg by mouth at bedtime.      carvedilol (COREG) 25 MG tablet Take 25 mg by mouth 2 (two) times daily with a meal.      cetirizine (ZYRTEC) 10 MG tablet Take 10 mg by mouth daily at 12 noon.      ezetimibe (ZETIA) 10 MG tablet Take 10 mg by mouth daily.      nitroGLYCERIN (NITROSTAT) 0.4 MG SL tablet Place 0.4 mg under the tongue as directed.      omeprazole (PRILOSEC) 20 MG capsule Take 20 mg by mouth 2 (two) times daily.      valsartan-hydrochlorothiazide (DIOVAN-HCT) 160-25 MG tablet Take 1 tablet by mouth daily.      Scheduled:  amLODipine  10 mg Oral Daily   carvedilol  25 mg Oral BID WC   Chlorhexidine Gluconate Cloth  6 each Topical Q0600   enoxaparin (LOVENOX) injection  40 mg Subcutaneous Q24H   feeding supplement  237 mL Oral BID BM   HYDROmorphone HCl  0.5 mg Oral Once   multivitamin with minerals  1  tablet Oral Daily   pantoprazole (PROTONIX) IV  40 mg Intravenous Q12H   polyethylene glycol  17 g Oral BID   Continuous:  piperacillin-tazobactam (ZOSYN)  IV 3.375 g (02/22/21 0944)   [id number] (DILAUDID) injection, labetalol, ondansetron (ZOFRAN) IV, oxyCODONE, simethicone Anti-infectives (From admission, onward)    Start     Dose/Rate Route Frequency Ordered Stop   02/20/21 1800  piperacillin-tazobactam (ZOSYN) IVPB 3.375 g        3.375 g 12.5 mL/hr over 240 Minutes Intravenous Every 8 hours 02/20/21 1731     02/19/21 1900  cefTRIAXone (ROCEPHIN) 2 g in sodium chloride 0.9 % 100 mL IVPB  Status:  Discontinued        2 g 200 mL/hr over 30 Minutes Intravenous Every 24 hours 02/19/21 1759 02/20/21 1731      Scheduled Meds:  amLODipine  10 mg Oral Daily   carvedilol  25 mg Oral BID WC   Chlorhexidine Gluconate Cloth  6 each Topical Q0600   enoxaparin (LOVENOX) injection  40 mg Subcutaneous Q24H   feeding supplement  237 mL Oral BID BM  HYDROmorphone HCl  0.5 mg Oral Once   multivitamin with minerals  1 tablet Oral Daily   pantoprazole (PROTONIX) IV  40 mg Intravenous Q12H   polyethylene glycol  17 g Oral BID   Continuous Infusions:  piperacillin-tazobactam (ZOSYN)  IV 3.375 g (02/22/21 0944)   PRN Meds:.HYDROmorphone (DILAUDID) injection, labetalol, ondansetron (ZOFRAN) IV, oxyCODONE, simethicone   Assessment: Principal Problem:   Acute pancreatitis Active Problems:   Abdominal pain   Nausea & vomiting   Elevated lipase   Hyperglycemia   Transaminitis   Biliary sludge determined by ultrasound   Hepatic steatosis   Hypertensive urgency   Obesity (BMI 30.0-34.9)   Essential hypertension   Mixed hyperlipidemia   CAD (coronary artery disease)   Dehydration  60 years old Hispanic female with acute calculus cholecystitis s/p robotic assisted laparoscopic cholecystectomy and drain placement, acute gallstone pancreatitis.  GI is reconsulted due to worsening LFTs  within last 24 hours  Plan: Elevated LFTs LFTs have worsened last 24 hours alkaline phosphatase 130 from 45, AST 262 from 43, ALT 213 from 99 total bilirubin 2.3 from 0.8 Serum lipase is also elevated to 589 MRCP did not reveal choledocholithiasis or biliary dilatation Do not recommend ERCP at this time Elevated LFTs could be transiently elevated secondary to soft tissue edema at the sphincter of Oddi Monitor LFTs daily Recommend clear liquid diet as patient is complaining of epigastric pain and serum lipase is elevated  Constipation Start bowel regimen as patient is on opioid medication  MiraLAX 17 g twice daily    LOS: 4 days   Linda Clark 02/22/2021, 4:01 PM

## 2021-02-22 NOTE — Progress Notes (Signed)
We got the patient up to ambulate in the hall and she was just able to get to the door and then started vomiting a small amt of clear fluid. Got the patient back to her chair at bedside and nausea slowly resolved. Refused antiemtic saying the nausea was going away.

## 2021-02-23 LAB — COMPREHENSIVE METABOLIC PANEL
ALT: 190 U/L — ABNORMAL HIGH (ref 0–44)
AST: 88 U/L — ABNORMAL HIGH (ref 15–41)
Albumin: 2.8 g/dL — ABNORMAL LOW (ref 3.5–5.0)
Alkaline Phosphatase: 124 U/L (ref 38–126)
Anion gap: 7 (ref 5–15)
BUN: 12 mg/dL (ref 6–20)
CO2: 30 mmol/L (ref 22–32)
Calcium: 7.9 mg/dL — ABNORMAL LOW (ref 8.9–10.3)
Chloride: 99 mmol/L (ref 98–111)
Creatinine, Ser: 0.4 mg/dL — ABNORMAL LOW (ref 0.44–1.00)
GFR, Estimated: >60 mL/min (ref >60)
Glucose, Bld: 95 mg/dL (ref 70–99)
Potassium: 3.2 mmol/L — ABNORMAL LOW (ref 3.5–5.1)
Sodium: 136 mmol/L (ref 135–145)
Total Bilirubin: 1.5 mg/dL — ABNORMAL HIGH (ref 0.3–1.2)
Total Protein: 6 g/dL — ABNORMAL LOW (ref 6.5–8.1)

## 2021-02-23 LAB — CBC
HCT: 39 % (ref 36.0–46.0)
Hemoglobin: 12.5 g/dL (ref 12.0–15.0)
MCH: 30.6 pg (ref 26.0–34.0)
MCHC: 32.1 g/dL (ref 30.0–36.0)
MCV: 95.6 fL (ref 80.0–100.0)
Platelets: 250 10*3/uL (ref 150–400)
RBC: 4.08 MIL/uL (ref 3.87–5.11)
RDW: 13.1 % (ref 11.5–15.5)
WBC: 10.4 10*3/uL (ref 4.0–10.5)
nRBC: 0 % (ref 0.0–0.2)

## 2021-02-23 LAB — SURGICAL PATHOLOGY

## 2021-02-23 MED ORDER — POTASSIUM CHLORIDE CRYS ER 20 MEQ PO TBCR
40.0000 meq | EXTENDED_RELEASE_TABLET | ORAL | Status: AC
  Administered 2021-02-23 (×2): 40 meq via ORAL

## 2021-02-23 MED ORDER — HYDROMORPHONE HCL 1 MG/ML IJ SOLN
0.5000 mg | Freq: Three times a day (TID) | INTRAMUSCULAR | Status: DC | PRN
Start: 2021-02-23 — End: 2021-02-24
  Administered 2021-02-23: 22:00:00 1 mg via INTRAVENOUS

## 2021-02-23 NOTE — Discharge Instructions (Signed)
In addition to included general post-operative instructions,  Diet: Resume home diet. Recommend avoiding or limiting fatty/greasy foods over the next few days/week. If you do eat these, you may (or may not) notice diarrhea. This is expected while your body adjusts to not having a gallbladder, and it typically resolves with time.    Activity: No heavy lifting >20 pounds (children, pets, laundry, garbage) or strenuous activity for 4 weeks, but light activity and walking are encouraged. Do not drive or drink alcohol if taking narcotic pain medications or having pain that might distract from driving.  Wound care: You may shower/get incision wet with soapy water and pat dry (do not rub incisions), but no baths or submerging incision underwater until follow-up.   Medications: Resume all home medications. For mild to moderate pain: acetaminophen (Tylenol) or ibuprofen/naproxen (if no kidney disease). Combining Tylenol with alcohol can substantially increase your risk of causing liver disease. Narcotic pain medications, if prescribed, can be used for severe pain, though may cause nausea, constipation, and drowsiness. Do not combine Tylenol and Percocet (or similar) within a 6 hour period as Percocet (and similar) contain(s) Tylenol. If you do not need the narcotic pain medication, you do not need to fill the prescription.  Call office [phone] / [phone]) at any time if any questions, worsening pain, fevers/chills, bleeding, drainage from incision site, or other concerns.

## 2021-02-23 NOTE — Progress Notes (Signed)
[facility] SURGICAL PROGRESS NOTE  Hospital Day(s): 5.   Post op day(s): 3 Days Post-Op.   Interval History: Patient seen and examined, no acute events or new complaints overnight. Patient reports she continues to improve. She remains without leukocytosis; WBC 10.4K.  Renal function remains normal; sCr - 0.40. Hyperbilirubinemia improving; down to 1.5. LFTs improved as well. She is on CLD and tolerating well. Repeat MRCP yesterday showed improved pancreatitis, no evidence of retained stone.    Vital signs in last 24 hours: [min-max] current  Temp:  [98.2 F (36.8 C)-99.5 F (37.5 C)] 98.9 F (37.2 C) (11/29 0745) Pulse Rate:  [69-75] 73 (11/29 0745) Resp:  [16-20] 20 (11/29 0745) BP: (112-139)/(70-85) 134/85 (11/29 0745) SpO2:  [94 %-97 %] 97 % (11/29 0745)     Height: 5\' 2"  (157.5 cm) Weight: 77.7 kg BMI (Calculated): 31.32   Intake/Output last 2 shifts:  11/28 0701 - 11/29 0700 In: 347.5 [I.V.:230.9; IV Piggyback:116.7] Out: 160 [Drains:160]   Physical Exam:  Constitutional: alert, cooperative and no distress Respiratory: breathing non-labored at rest  Cardiovascular: regular rate and sinus rhythm  Gastrointestinal: Soft, incisional soreness, non-distended, no rebound/guarding, drain present; output serous  Integumentary: Laparoscopic incisions are CDI with dermabond, no erythema or drainage    Labs:  CBC Latest Ref Rng & Units 02/23/2021 02/22/2021 02/21/2021  WBC 4.0 - 10.5 K/uL 10.4 9.3 11.3(H)  Hemoglobin 12.0 - 15.0 g/dL 02/23/2021 62.1 30.8  Hematocrit 36.0 - 46.0 % 39.0 42.0 39.0  Platelets 150 - 400 K/uL 250 227 244   CMP Latest Ref Rng & Units 02/23/2021 02/22/2021 02/21/2021  Glucose 70 - 99 mg/dL 95 02/23/2021) 846(N)  BUN 6 - 20 mg/dL 12 14 16   Creatinine 0.44 - 1.00 mg/dL 629(B) ) 2.84(X)  Sodium 135 - 145 mmol/L 136 138 138  Potassium 3.5 - 5.1 mmol/L 3.2(L) 3.9 3.3(L)  Chloride 98 - 111 mmol/L 99 103 103  CO2 22 - 32 mmol/L 30 31 30    Calcium 8.9 - 10.3 mg/dL 7.9(L) 7.8(L) 8.1(L)  Total Protein 6.5 - 8.1 g/dL 6.0(L) 6.1(L) 6.2(L)  Total Bilirubin 0.3 - 1.2 mg/dL 3.24(M) 2.3(H) 0.8  Alkaline Phos 38 - 126 U/L 124 130(H) 45  AST 15 - 41 U/L 88(H) 262(H) 43(H)  ALT 0 - 44 U/L 190(H) 213(H) 99(H)     Imaging studies: No new pertinent imaging studies   Assessment/Plan:  60 y.o. female with 3 Days Post-Op s/p robotic cholecystectomy for gallstone pancreatitis and acute cholecystitis   - Okay to advance diet as tolerated - Continue IV Abx (Zosyn); Augmentin for home x7 days - Remove JP drain today; place dressing   - Monitor abdominal examination   - Pain control prn; antiemetics prn   - Monitor hyperbilirubinemia; improved; MRCP negative for choledocholithiasis - Further management per primary service  - Discharge Planning; General surgery will sign off, will place follow up for 2-3 weeks and update DC instructions    All of the above findings and recommendations were discussed with the patient, and the medical team, and all of patient's questions were answered to her expressed satisfaction.  -- , PA-C [facility] 02/23/2021, 10:02 AM [phone] M-F: 7am - 4pm

## 2021-02-23 NOTE — Progress Notes (Signed)
PROGRESS NOTE  Linda Clark IRW:[id number] DOB: 1960-11-05 DOA: 02/18/2021 PCP: Preston Fleeting, MD   LOS: 5 days   Brief Narrative / Interim history: 60 year old female with history of HTN, HLD, GERD, comes into the hospital with abdominal pain associated with vomiting.  Pain was mainly epigastric area radiating to the back.  She was found to have acute pancreatitis with a significantly elevated lipase.  Right upper quadrant ultrasound showed gallbladder sludge, underwent an MRCP which showed moderate to severe acute pancreatitis and dependent material in the gallbladder likely representing sludge and possible small stones.  CBD was mildly dilated but no choledocholithiasis was seen.  GI and general surgery was consulted and she was admitted to the hospital.  She is status postcholecystectomy on 11/26.  Drain removed 11/29   Subjective / 24h Interval events: Feeling better today than yesterday.  Less abdominal pain.  Yesterday felt quite bad especially when she tries to eat more solid diet.  Denies any shortness of breath  Interpreter iPad used for interview  Assessment & Plan: Principal Problem Acute pancreatitis, likely gallstone related -possibly she passed a gallstone given CBD dilation but there is no current choledocholithiasis noticed on the MRCP.  Gastroenterology and general surgery consulted.  Patient was taken to the OR on 11/26 found to have severe acute cholecystitis with gallbladder empyema. -she was placed on Zosyn, continue, surgery recommends 7 days of antibiotics and transition to Augmentin if she discharges home -Drain removed today per general surgery, surgical team signed off. -Advance diet again today as yesterday she could not tolerate solids.  Anticipate home on Wednesday if she is able to eat well and pain is controlled on oral agents.  Space out Dilaudid and focus on using oxycodone for pain  Active Problems Hypertensive urgency,  essential hypertension-continue medications as below  Elevated LFTs-due to #1, improving today  Hypokalemia-continue to replace and keep monitoring  Hyperlipidemia-hold statin, at home she is on atorvastatin and Zetia.  Monitor LFTs.  Could probably resume on discharge  Obesity, class I-BMI 31, she would benefit from weight loss  GERD-continue PPI  Scheduled Meds:  amLODipine  10 mg Oral Daily   carvedilol  25 mg Oral BID WC   Chlorhexidine Gluconate Cloth  6 each Topical Q0600   enoxaparin (LOVENOX) injection  40 mg Subcutaneous Q24H   HYDROmorphone HCl  0.5 mg Oral Once   multivitamin with minerals  1 tablet Oral Daily   pantoprazole (PROTONIX) IV  40 mg Intravenous Q12H   polyethylene glycol  17 g Oral BID   potassium chloride  40 mEq Oral Q3H   Continuous Infusions:  piperacillin-tazobactam (ZOSYN)  IV 3.375 g (02/23/21 0842)   PRN Meds:.HYDROmorphone (DILAUDID) injection, labetalol, ondansetron (ZOFRAN) IV, oxyCODONE, simethicone  Diet Orders (From admission, onward)     Start     Ordered   02/23/21 1024  Diet regular Room service appropriate? Yes; Fluid consistency: Thin  Diet effective now       Question Answer Comment  Room service appropriate? Yes   Fluid consistency: Thin      02/23/21 1023           DVT prophylaxis: enoxaparin (LOVENOX) injection 40 mg Start: 02/18/21 1745 SCDs Start: 02/18/21 1732    Code Status: Full Code  Family Communication: No family at bedside  Status is: Inpatient  Remains inpatient appropriate because: Severity of illness, drain in place, antibiotics  Level of care: Med-Surg  Consultants:  General surgery GI  Procedures:  Laparoscopic cholecystectomy 11/26  Microbiology  none  Antimicrobials: Ceftriaxone 11/25-11/26 Zosyn 11/26 >>   Objective: Vitals:   02/22/21 2011 02/23/21 0009 02/23/21 0548 02/23/21 0745  BP: 120/72 117/77 129/75 134/85  Pulse: 70 69 75 73  Resp: 17 16 17 20   Temp: 98.8 F (37.1 C)  99.5 F (37.5 C) 98.2 F (36.8 C) 98.9 F (37.2 C)  TempSrc: Oral  Oral Oral  SpO2: 95% 97% 96% 97%  Weight:      Height:        Intake/Output Summary (Last 24 hours) at 02/23/2021 1025 Last data filed at 02/23/2021 0500 Gross per 24 hour  Intake 347.54 ml  Output 160 ml  Net 187.54 ml    Filed Weights   02/18/21 0953  Weight: 77.7 kg    Examination:  Constitutional: No distress Eyes: Anicteric ENMT: mmm Neck: normal, supple Respiratory: Clear bilaterally, no wheezing or crackles Cardiovascular: Regular rate and rhythm, no murmurs, no edema Abdomen: Diffusely tender, no guarding, drain in place Musculoskeletal: no clubbing / cyanosis.  Skin: No rashes seen Neurologic: Nonfocal, equal strength  Data Reviewed: I have independently reviewed following labs and imaging studies   CBC: Recent Labs  Lab 02/17/21 1209 02/18/21 1005 02/19/21 0416 02/21/21 1225 02/22/21 0436 02/23/21 0507  WBC 8.9 9.6 14.0* 11.3* 9.3 10.4  NEUTROABS 6.4  --   --   --   --   --   HGB 16.0* 16.3* 15.6* 12.5 13.6 12.5  HCT 49.6* 50.5* 48.0* 39.0 42.0 39.0  MCV 92.5 93.5 92.8 93.5 93.3 95.6  PLT 301 341 291 244 227 250    Basic Metabolic Panel: Recent Labs  Lab 02/19/21 0416 02/20/21 0415 02/21/21 1225 02/22/21 0436 02/23/21 0507  NA 138 138 138 138 136  K 3.3* 3.3* 3.3* 3.9 3.2*  CL 103 102 103 103 99  CO2 27 29 30 31 30   GLUCOSE 122* 109* 133* 137* 95  BUN 18 15 16 14 12   CREATININE 0.43* 0.49 0.40* 0.36* 0.40*  CALCIUM 8.8* 8.5* 8.1* 7.8* 7.9*  MG 2.4  --   --   --   --   PHOS 3.6  --   --   --   --     Liver Function Tests: Recent Labs  Lab 02/19/21 0416 02/20/21 0415 02/21/21 1225 02/22/21 0436 02/23/21 0507  AST 225* 48* 43* 262* 88*  ALT 301* 151* 99* 213* 190*  ALKPHOS 82 58 45 130* 124  BILITOT 2.1* 1.5* 0.8 2.3* 1.5*  PROT 7.0 7.0 6.2* 6.1* 6.0*  ALBUMIN 3.9 3.9 3.0* 2.9* 2.8*    Coagulation Profile: Recent Labs  Lab 02/17/21 1321  INR 0.9     HbA1C: No results for input(s): HGBA1C in the last 72 hours. CBG: No results for input(s): GLUCAP in the last 168 hours.  Recent Results (from the past 240 hour(s))  Resp Panel by RT-PCR (Flu A&B, Covid) Nasopharyngeal Swab     Status: None   Collection Time: 02/17/21  1:21 PM   Specimen: Nasopharyngeal Swab; Nasopharyngeal(NP) swabs in vial transport medium  Result Value Ref Range Status   SARS Coronavirus 2 by RT PCR NEGATIVE NEGATIVE Final    Comment: (NOTE) SARS-CoV-2 target nucleic acids are NOT DETECTED.  The SARS-CoV-2 RNA is generally detectable in upper respiratory specimens during the acute phase of infection. The lowest concentration of SARS-CoV-2 viral copies this assay can detect is 138 copies/mL. A negative result does not preclude SARS-Cov-2 infection and should  not be used as the sole basis for treatment or other patient management decisions. A negative result may occur with  improper specimen collection/handling, submission of specimen other than nasopharyngeal swab, presence of viral mutation(s) within the areas targeted by this assay, and inadequate number of viral copies(<138 copies/mL). A negative result must be combined with clinical observations, patient history, and epidemiological information. The expected result is Negative.  Fact Sheet for Patients:  BloggerCourse.com  Fact Sheet for Healthcare Providers:  SeriousBroker.it  This test is no t yet approved or cleared by the [place] FDA and  has been authorized for detection and/or diagnosis of SARS-CoV-2 by FDA under an Emergency Use Authorization (EUA). This EUA will remain  in effect (meaning this test can be used) for the duration of the COVID-19 declaration under Section 564(b)(1) of the Act, 21 U.S.C.section 360bbb-3(b)(1), unless the authorization is terminated  or revoked sooner.       Influenza A by PCR NEGATIVE NEGATIVE Final    Influenza B by PCR NEGATIVE NEGATIVE Final    Comment: (NOTE) The Xpert Xpress SARS-CoV-2/FLU/RSV plus assay is intended as an aid in the diagnosis of influenza from Nasopharyngeal swab specimens and should not be used as a sole basis for treatment. Nasal washings and aspirates are unacceptable for Xpert Xpress SARS-CoV-2/FLU/RSV testing.  Fact Sheet for Patients: BloggerCourse.com  Fact Sheet for Healthcare Providers: SeriousBroker.it  This test is not yet approved or cleared by the [place] FDA and has been authorized for detection and/or diagnosis of SARS-CoV-2 by FDA under an Emergency Use Authorization (EUA). This EUA will remain in effect (meaning this test can be used) for the duration of the COVID-19 declaration under Section 564(b)(1) of the Act, 21 U.S.C. section 360bbb-3(b)(1), unless the authorization is terminated or revoked.  Performed at [facility], [address] [place], [place] [address]   CSF culture w Gram Stain     Status: None   Collection Time: 02/17/21  3:11 PM   Specimen: CSF; Cerebrospinal Fluid  Result Value Ref Range Status   Specimen Description   Final    CSF Performed at [facility], [address] [place], [place] [address]    Special Requests   Final    NONE Performed at [facility], [address] [place], [place] [address]    Gram Stain   Final    CYTOSPIN SLIDE WBC SEEN NO RBC SEEN NO ORGANISMS SEEN Performed at [facility], [address], [place], [place] [address]    Culture   Final    NO GROWTH Performed at [facility], [address] [place], [place] [address]    Report Status 02/21/2021 FINAL  Final  Resp Panel by RT-PCR (Flu A&B, Covid) Nasopharyngeal Swab     Status: None   Collection Time: 02/18/21 12:10 PM   Specimen: Nasopharyngeal Swab; Nasopharyngeal(NP) swabs in vial transport medium  Result Value Ref Range  Status   SARS Coronavirus 2 by RT PCR NEGATIVE NEGATIVE Final    Comment: (NOTE) SARS-CoV-2 target nucleic acids are NOT DETECTED.  The SARS-CoV-2 RNA is generally detectable in upper respiratory specimens during the acute phase of infection. The lowest concentration of SARS-CoV-2 viral copies this assay can detect is 138 copies/mL. A negative result does not preclude SARS-Cov-2 infection and should not be used as the sole basis for treatment or other patient management decisions. A negative result may occur with  improper specimen collection/handling, submission of specimen other than  nasopharyngeal swab, presence of viral mutation(s) within the areas targeted by this assay, and inadequate number of viral copies(<138 copies/mL). A negative result must be combined with clinical observations, patient history, and epidemiological information. The expected result is Negative.  Fact Sheet for Patients:  BloggerCourse.com  Fact Sheet for Healthcare Providers:  SeriousBroker.it  This test is no t yet approved or cleared by the [place] FDA and  has been authorized for detection and/or diagnosis of SARS-CoV-2 by FDA under an Emergency Use Authorization (EUA). This EUA will remain  in effect (meaning this test can be used) for the duration of the COVID-19 declaration under Section 564(b)(1) of the Act, 21 U.S.C.section 360bbb-3(b)(1), unless the authorization is terminated  or revoked sooner.       Influenza A by PCR NEGATIVE NEGATIVE Final   Influenza B by PCR NEGATIVE NEGATIVE Final    Comment: (NOTE) The Xpert Xpress SARS-CoV-2/FLU/RSV plus assay is intended as an aid in the diagnosis of influenza from Nasopharyngeal swab specimens and should not be used as a sole basis for treatment. Nasal washings and aspirates are unacceptable for Xpert Xpress SARS-CoV-2/FLU/RSV testing.  Fact Sheet for  Patients: BloggerCourse.com  Fact Sheet for Healthcare Providers: SeriousBroker.it  This test is not yet approved or cleared by the [place] FDA and has been authorized for detection and/or diagnosis of SARS-CoV-2 by FDA under an Emergency Use Authorization (EUA). This EUA will remain in effect (meaning this test can be used) for the duration of the COVID-19 declaration under Section 564(b)(1) of the Act, 21 U.S.C. section 360bbb-3(b)(1), unless the authorization is terminated or revoked.  Performed at [facility], [address] [place], [place] [address]       Radiology Studies: MR 3D Recon At Scanner  Result Date: 02/22/2021 CLINICAL DATA:  Abdominal pain. Two days status post cholecystectomy. Acute pancreatitis. EXAM: MRI ABDOMEN WITHOUT AND WITH CONTRAST (INCLUDING MRCP) TECHNIQUE: Multiplanar multisequence MR imaging of the abdomen was performed both before and after the administration of intravenous contrast. Heavily T2-weighted images of the biliary and pancreatic ducts were obtained, and three-dimensional MRCP images were rendered by post processing. CONTRAST:  7mL GADAVIST GADOBUTROL 1 MMOL/ML IV SOLN COMPARISON:  02/18/2021 FINDINGS: Lower chest: New small bilateral pleural effusions and bibasilar atelectasis. Hepatobiliary: Multiple small hepatic cysts are again seen. No hepatic masses identified. Patient has undergone cholecystectomy since prior study. No evidence of biliary ductal dilatation or choledocholithiasis. No abnormal fluid collections seen within the abdomen. Pancreas: Diffuse pancreatic edema and peripancreatic fluid show improvement since previous study, consistent with improving acute pancreatitis. No evidence of pancreatic mass, necrosis, or pseudocyst. No evidence of pancreatic ductal dilatation or pancreas divisum. Spleen:  Within normal limits in size and appearance. Adrenals/Urinary Tract: No  masses identified. A few simple renal cysts are noted bilaterally. A 1.3 cm Bosniak category 2 hemorrhagic cyst is also seen in the lower pole of the right kidney. No evidence of renal mass or hydronephrosis. Stomach/Bowel: Visualized portion unremarkable. Vascular/Lymphatic: No pathologically enlarged lymph nodes identified. No acute vascular findings. Other:  None. Musculoskeletal:  No suspicious bone lesions identified. IMPRESSION: Status post cholecystectomy. No evidence of biliary ductal dilatation or choledocholithiasis. Improved acute pancreatitis. No evidence of pancreatic necrosis, pseudocyst, or mass. New small bilateral pleural effusions and bibasilar atelectasis. Electronically Signed   By: Danae Orleans M.D.   On: 02/22/2021 14:01   MR ABDOMEN MRCP W WO CONTAST  Result Date: 02/22/2021 CLINICAL DATA:  Abdominal pain. Two days status post cholecystectomy.  Acute pancreatitis. EXAM: MRI ABDOMEN WITHOUT AND WITH CONTRAST (INCLUDING MRCP) TECHNIQUE: Multiplanar multisequence MR imaging of the abdomen was performed both before and after the administration of intravenous contrast. Heavily T2-weighted images of the biliary and pancreatic ducts were obtained, and three-dimensional MRCP images were rendered by post processing. CONTRAST:  75mL GADAVIST GADOBUTROL 1 MMOL/ML IV SOLN COMPARISON:  02/18/2021 FINDINGS: Lower chest: New small bilateral pleural effusions and bibasilar atelectasis. Hepatobiliary: Multiple small hepatic cysts are again seen. No hepatic masses identified. Patient has undergone cholecystectomy since prior study. No evidence of biliary ductal dilatation or choledocholithiasis. No abnormal fluid collections seen within the abdomen. Pancreas: Diffuse pancreatic edema and peripancreatic fluid show improvement since previous study, consistent with improving acute pancreatitis. No evidence of pancreatic mass, necrosis, or pseudocyst. No evidence of pancreatic ductal dilatation or pancreas  divisum. Spleen:  Within normal limits in size and appearance. Adrenals/Urinary Tract: No masses identified. A few simple renal cysts are noted bilaterally. A 1.3 cm Bosniak category 2 hemorrhagic cyst is also seen in the lower pole of the right kidney. No evidence of renal mass or hydronephrosis. Stomach/Bowel: Visualized portion unremarkable. Vascular/Lymphatic: No pathologically enlarged lymph nodes identified. No acute vascular findings. Other:  None. Musculoskeletal:  No suspicious bone lesions identified. IMPRESSION: Status post cholecystectomy. No evidence of biliary ductal dilatation or choledocholithiasis. Improved acute pancreatitis. No evidence of pancreatic necrosis, pseudocyst, or mass. New small bilateral pleural effusions and bibasilar atelectasis. Electronically Signed   By: Danae Orleans M.D.   On: 02/22/2021 14:01    Pamella Pert, MD, PhD [facility]  Between 7 am - 7 pm I am available, please contact me via Amion (for emergencies) or Securechat (non urgent messages)  Between 7 pm - 7 am I am not available, please contact night coverage MD/APP via Amion

## 2021-02-24 ENCOUNTER — Inpatient Hospital Stay: Payer: Self-pay

## 2021-02-24 DIAGNOSIS — K8512 Biliary acute pancreatitis with infected necrosis: Secondary | ICD-10-CM

## 2021-02-24 LAB — COMPREHENSIVE METABOLIC PANEL
ALT: 113 U/L — ABNORMAL HIGH (ref 0–44)
AST: 24 U/L (ref 15–41)
Albumin: 2.7 g/dL — ABNORMAL LOW (ref 3.5–5.0)
Alkaline Phosphatase: 89 U/L (ref 38–126)
Anion gap: 6 (ref 5–15)
BUN: 10 mg/dL (ref 6–20)
CO2: 27 mmol/L (ref 22–32)
Calcium: 8 mg/dL — ABNORMAL LOW (ref 8.9–10.3)
Chloride: 102 mmol/L (ref 98–111)
Creatinine, Ser: 0.4 mg/dL — ABNORMAL LOW (ref 0.44–1.00)
GFR, Estimated: >60 mL/min (ref >60)
Glucose, Bld: 101 mg/dL — ABNORMAL HIGH (ref 70–99)
Potassium: 3.3 mmol/L — ABNORMAL LOW (ref 3.5–5.1)
Sodium: 135 mmol/L (ref 135–145)
Total Bilirubin: 1.1 mg/dL (ref 0.3–1.2)
Total Protein: 5.9 g/dL — ABNORMAL LOW (ref 6.5–8.1)

## 2021-02-24 LAB — CBC
HCT: 38.9 % (ref 36.0–46.0)
Hemoglobin: 12.6 g/dL (ref 12.0–15.0)
MCH: 29.9 pg (ref 26.0–34.0)
MCHC: 32.4 g/dL (ref 30.0–36.0)
MCV: 92.4 fL (ref 80.0–100.0)
Platelets: 272 10*3/uL (ref 150–400)
RBC: 4.21 MIL/uL (ref 3.87–5.11)
RDW: 13 % (ref 11.5–15.5)
WBC: 10.5 10*3/uL (ref 4.0–10.5)
nRBC: 0 % (ref 0.0–0.2)

## 2021-02-24 LAB — LIPASE, BLOOD: Lipase: 44 U/L (ref 11–51)

## 2021-02-24 IMAGING — DX DG ABDOMEN 1V
2 series · 2 of 2 positions shown · non-contrast
Comparison: None.

CLINICAL DATA: Acute pancreatitis, abdominal pain, constipation.

EXAM:
ABDOMEN - 1 VIEW

[abdomen supine (1 of 2)]
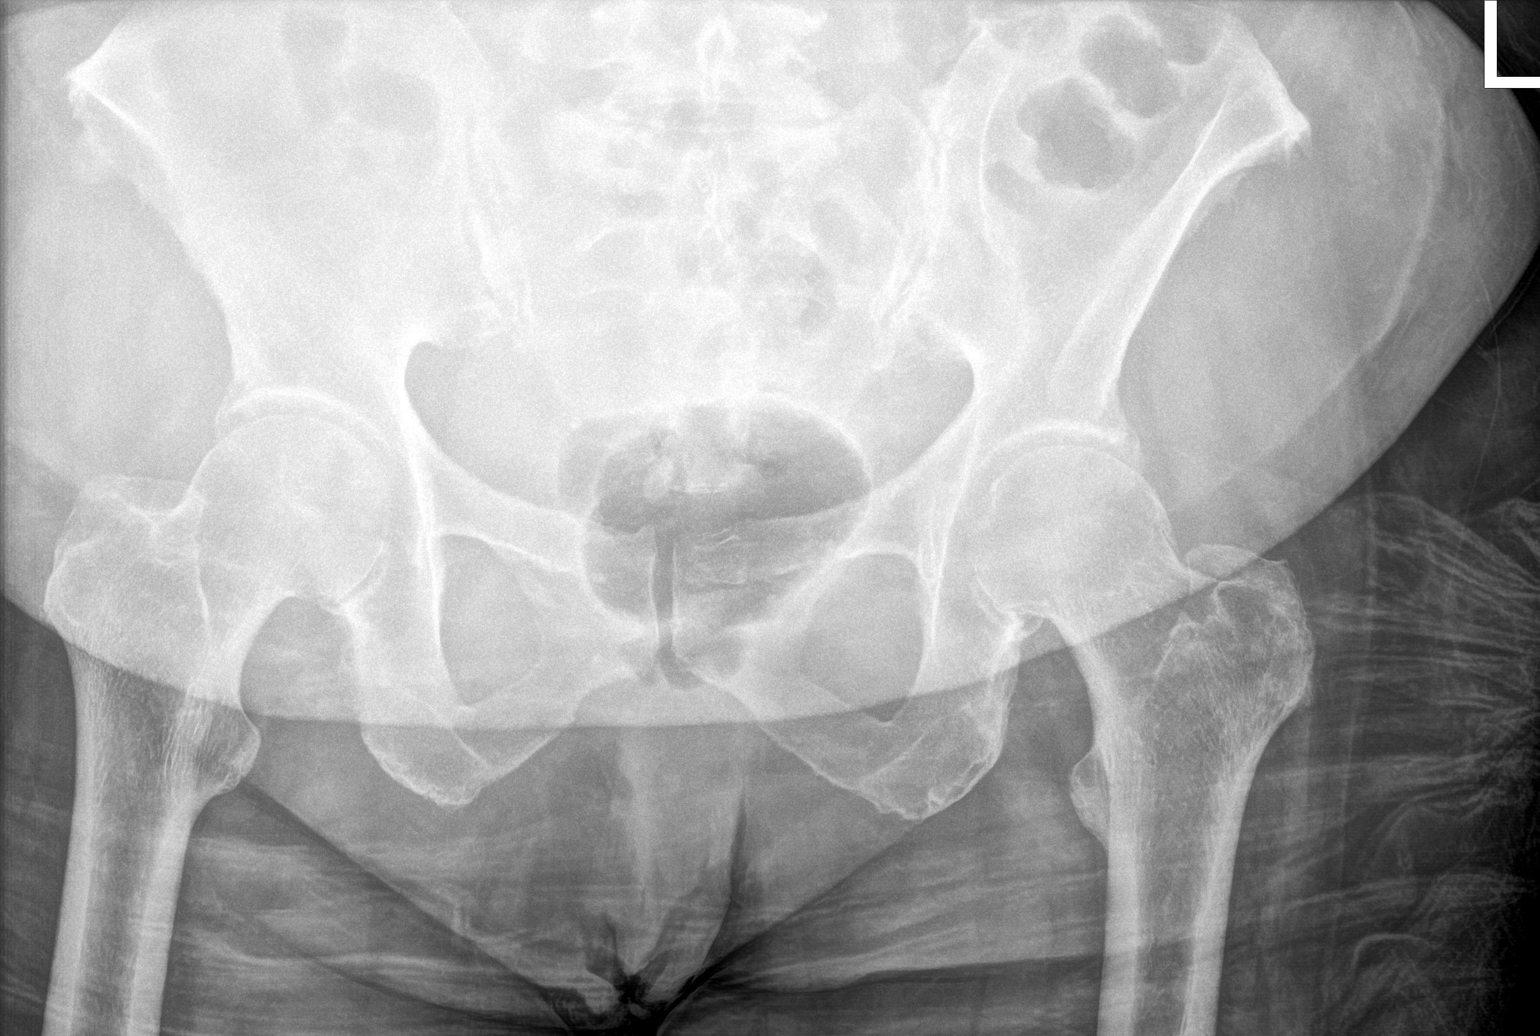

[abdomen supine (2 of 2)]
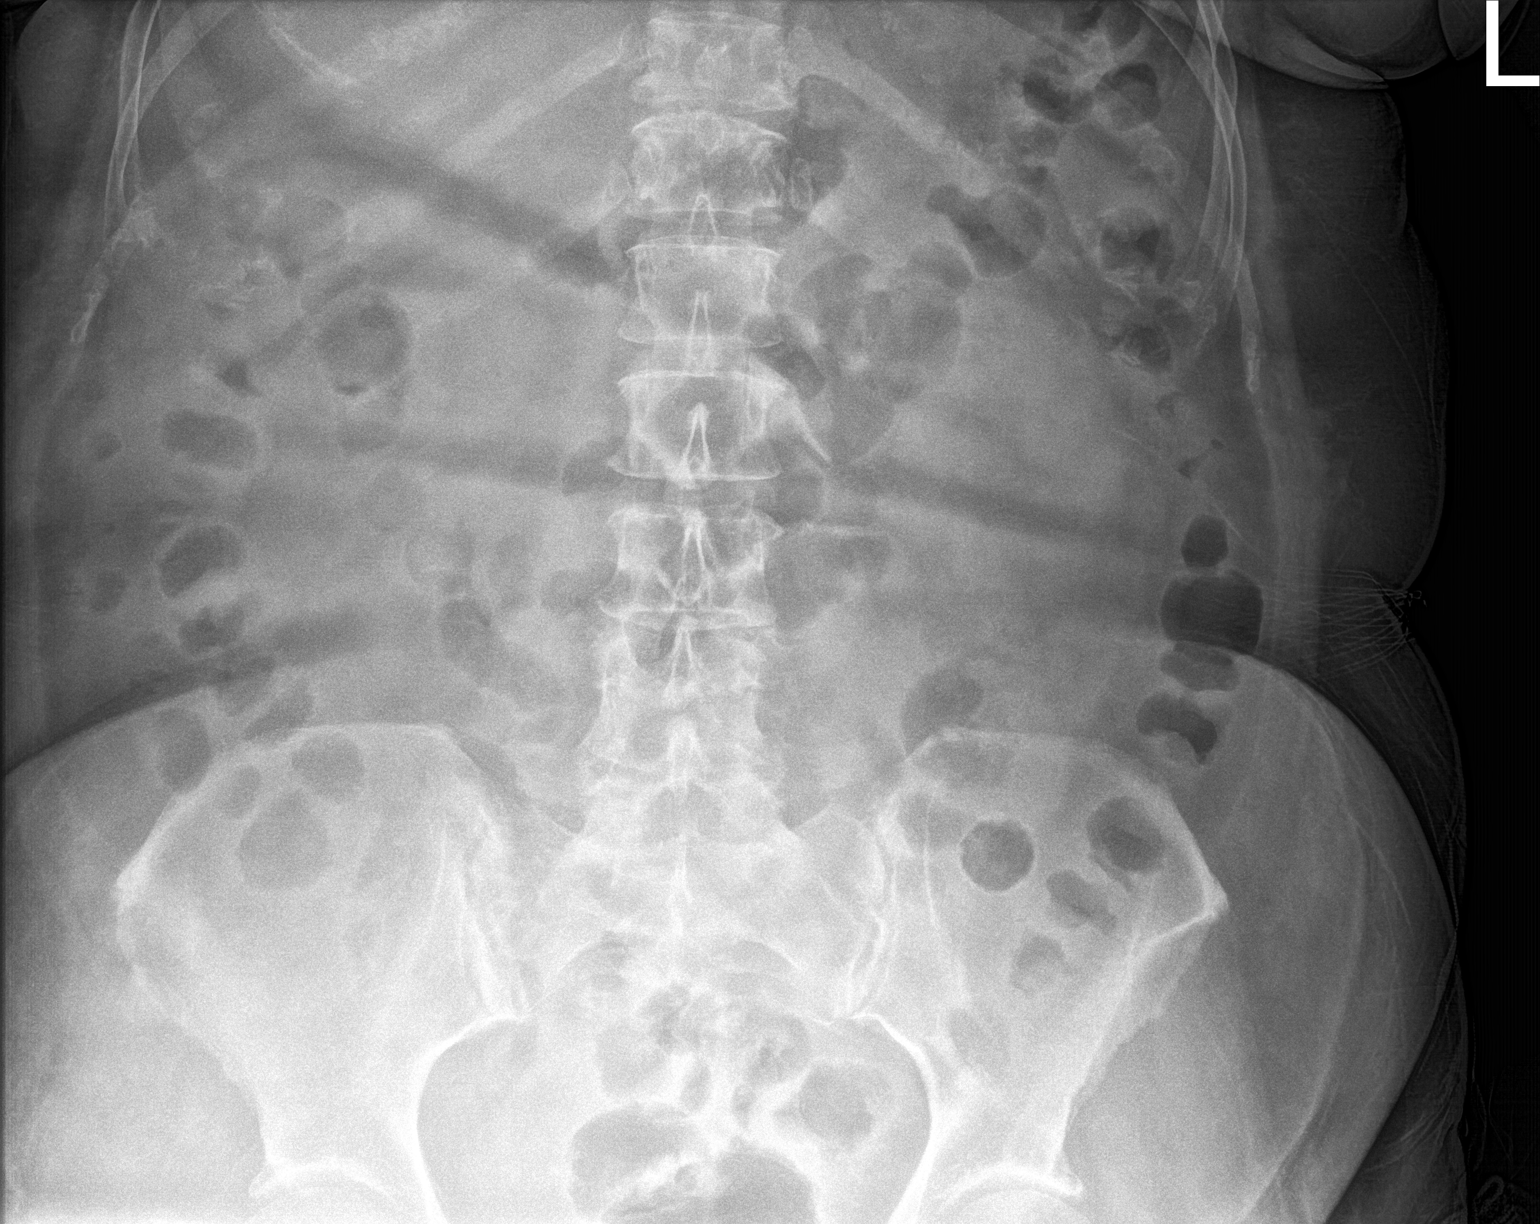

[2 of 2 positions shown; findings below may reference images not displayed]

FINDINGS: The bowel gas pattern is normal. No radio-opaque calculi or other
significant radiographic abnormality are seen.
IMPRESSION: Negative.

## 2021-02-24 MED ORDER — POTASSIUM CHLORIDE CRYS ER 20 MEQ PO TBCR
40.0000 meq | EXTENDED_RELEASE_TABLET | Freq: Once | ORAL | Status: AC
  Administered 2021-02-24: 09:00:00 40 meq via ORAL
  Filled 2021-02-24: qty 2

## 2021-02-24 MED ORDER — DICYCLOMINE HCL 20 MG PO TABS
20.0000 mg | ORAL_TABLET | Freq: Three times a day (TID) | ORAL | Status: DC
  Administered 2021-02-25 (×2): 20 mg via ORAL
  Filled 2021-02-24 (×4): qty 1

## 2021-02-24 NOTE — Progress Notes (Signed)
PROGRESS NOTE    Linda [place]  JQB:[id number] DOB: 1960-04-25 DOA: 02/18/2021 PCP: Preston Fleeting, MD    Brief Narrative:  60 year old female with history of HTN, HLD, GERD, comes into the hospital with abdominal pain associated with vomiting.  Pain was mainly epigastric area radiating to the back.  She was found to have acute pancreatitis with a significantly elevated lipase.  Right upper quadrant ultrasound showed gallbladder sludge, underwent an MRCP which showed moderate to severe acute pancreatitis and dependent material in the gallbladder likely representing sludge and possible small stones.  CBD was mildly dilated but no choledocholithiasis was seen.  GI and general surgery was consulted and she was admitted to the hospital.  She is status postcholecystectomy on 11/26.  Drain removed 11/29    Assessment & Plan:   Principal Problem:   Acute pancreatitis Active Problems:   Abdominal pain   Nausea & vomiting   Elevated lipase   Hyperglycemia   Transaminitis   Biliary sludge determined by ultrasound   Hepatic steatosis   Hypertensive urgency   Obesity (BMI 30.0-34.9)   Essential hypertension   Mixed hyperlipidemia   CAD (coronary artery disease)   Dehydration  Acute pancreatitis, likely gallstone related -possibly she passed a gallstone given CBD dilation but there is no current choledocholithiasis noticed on the MRCP.  Gastroenterology and general surgery consulted.  Patient was taken to the OR on 11/26 found to have severe acute cholecystitis with gallbladder empyema. -she was placed on Zosyn, continue, surgery recommends 7 days of antibiotics and transition to Augmentin if she discharges home -Drain removed 11/29 per general surgery, surgical team signed off. Plan: Patient still endorsing abdominal pain.  KUB negative for constipation.  Lipase normalized.  Likely residual pain from severity of acute illness.  Not tolerating p.o. well.  Will  monitor in house for additional 24 hours.  Anticipated date of discharge 12/1.  Discontinue IV Dilaudid.  Use p.o. oxycodone sparingly.   Hypertensive urgency, essential hypertension-blood pressure well controlled.  Continue current regimen   Elevated LFTs-improving   Hypokalemia-monitor and replace as necessary   Hyperlipidemia-hold statin, at home she is on atorvastatin and Zetia.  Can likely resume on discharge   Obesity, class I-BMI 31, she would benefit from weight loss   DVT prophylaxis: SQ Lovenox Code Status: Full Family Communication: None today Disposition Plan: Status is: Inpatient  Remains inpatient appropriate because: Improving symptomatic ulcer and pancreatitis.  Surgical drain removed.  P.o. intake has been minimal.  Will monitor overnight and anticipate discharge on 12/1       Level of care: Med-Surg  Consultants:  None, GI and surgery signed off  Procedures:  Cholecystectomy  Antimicrobials: Zosyn   Subjective: Seen and examined.  History conducted in [place].  Patient continues to endorse abdominal pain and fullness  Objective: Vitals:   02/24/21 0019 02/24/21 0405 02/24/21 0742 02/24/21 1100  BP: 123/79 (!) 144/93 131/88 113/74  Pulse: 68 79 83 67  Resp: 15 16 18 18   Temp: 98.9 F (37.2 C) 98.6 F (37 C) 98.3 F (36.8 C)   TempSrc: Oral Oral Oral   SpO2: 93% 94% 95% 100%  Weight:      Height:        Intake/Output Summary (Last 24 hours) at 02/24/2021 1533 Last data filed at 02/24/2021 1026 Gross per 24 hour  Intake 441.74 ml  Output 1550 ml  Net -1108.26 ml   Filed Weights   02/18/21 0953  Weight: 77.7  kg    Examination:  General exam: Minimal distress due to pain Respiratory system: Clear to auscultation. Respiratory effort normal. Cardiovascular system: S1-S2, RRR, no murmurs, no pedal edema Gastrointestinal system: Soft, nondistended, tender to palpation epigastrium, normal bowel sounds Central nervous system: Alert and  oriented. No focal neurological deficits. Extremities: Symmetric 5 x 5 power. Skin: No rashes, lesions or ulcers Psychiatry: Judgement and insight appear normal. Mood & affect appropriate.     Data Reviewed: I have personally reviewed following labs and imaging studies  CBC: Recent Labs  Lab 02/19/21 0416 02/21/21 1225 02/22/21 0436 02/23/21 0507 02/24/21 0535  WBC 14.0* 11.3* 9.3 10.4 10.5  HGB 15.6* 12.5 13.6 12.5 12.6  HCT 48.0* 39.0 42.0 39.0 38.9  MCV 92.8 93.5 93.3 95.6 92.4  PLT 291 244 227 250 272   Basic Metabolic Panel: Recent Labs  Lab 02/19/21 0416 02/20/21 0415 02/21/21 1225 02/22/21 0436 02/23/21 0507 02/24/21 0535  NA 138 138 138 138 136 135  K 3.3* 3.3* 3.3* 3.9 3.2* 3.3*  CL 103 102 103 103 99 102  CO2 27 29 30 31 30 27   GLUCOSE 122* 109* 133* 137* 95 101*  BUN 18 15 16 14 12 10   CREATININE 0.43* 0.49 0.40* 0.36* 0.40* 0.40*  CALCIUM 8.8* 8.5* 8.1* 7.8* 7.9* 8.0*  MG 2.4  --   --   --   --   --   PHOS 3.6  --   --   --   --   --    GFR: Estimated Creatinine Clearance: 73.9 mL/min (A) (by C-G formula based on SCr of 0.4 mg/dL (L)). Liver Function Tests: Recent Labs  Lab 02/20/21 0415 02/21/21 1225 02/22/21 0436 02/23/21 0507 02/24/21 0535  AST 48* 43* 262* 88* 24  ALT 151* 99* 213* 190* 113*  ALKPHOS 58 45 130* 124 89  BILITOT 1.5* 0.8 2.3* 1.5* 1.1  PROT 7.0 6.2* 6.1* 6.0* 5.9*  ALBUMIN 3.9 3.0* 2.9* 2.8* 2.7*   Recent Labs  Lab 02/18/21 1005 02/19/21 1432 02/20/21 0415 02/22/21 0436 02/24/21 0535  LIPASE 3,473* 978* 144* 589* 44   No results for input(s): AMMONIA in the last 168 hours. Coagulation Profile: No results for input(s): INR, PROTIME in the last 168 hours. Cardiac Enzymes: No results for input(s): CKTOTAL, CKMB, CKMBINDEX, TROPONINI in the last 168 hours. BNP (last 3 results) No results for input(s): PROBNP in the last 8760 hours. HbA1C: No results for input(s): HGBA1C in the last 72 hours. CBG: No results for  input(s): GLUCAP in the last 168 hours. Lipid Profile: No results for input(s): CHOL, HDL, LDLCALC, TRIG, CHOLHDL, LDLDIRECT in the last 72 hours. Thyroid Function Tests: No results for input(s): TSH, T4TOTAL, FREET4, T3FREE, THYROIDAB in the last 72 hours. Anemia Panel: No results for input(s): VITAMINB12, FOLATE, FERRITIN, TIBC, IRON, RETICCTPCT in the last 72 hours. Sepsis Labs: No results for input(s): PROCALCITON, LATICACIDVEN in the last 168 hours.  Recent Results (from the past 240 hour(s))  Resp Panel by RT-PCR (Flu A&B, Covid) Nasopharyngeal Swab     Status: None   Collection Time: 02/17/21  1:21 PM   Specimen: Nasopharyngeal Swab; Nasopharyngeal(NP) swabs in vial transport medium  Result Value Ref Range Status   SARS Coronavirus 2 by RT PCR NEGATIVE NEGATIVE Final    Comment: (NOTE) SARS-CoV-2 target nucleic acids are NOT DETECTED.  The SARS-CoV-2 RNA is generally detectable in upper respiratory specimens during the acute phase of infection. The lowest concentration of SARS-CoV-2 viral copies this  assay can detect is 138 copies/mL. A negative result does not preclude SARS-Cov-2 infection and should not be used as the sole basis for treatment or other patient management decisions. A negative result may occur with  improper specimen collection/handling, submission of specimen other than nasopharyngeal swab, presence of viral mutation(s) within the areas targeted by this assay, and inadequate number of viral copies(<138 copies/mL). A negative result must be combined with clinical observations, patient history, and epidemiological information. The expected result is Negative.  Fact Sheet for Patients:  BloggerCourse.com  Fact Sheet for Healthcare Providers:  SeriousBroker.it  This test is no t yet approved or cleared by the [place] FDA and  has been authorized for detection and/or diagnosis of SARS-CoV-2 by FDA under  an Emergency Use Authorization (EUA). This EUA will remain  in effect (meaning this test can be used) for the duration of the COVID-19 declaration under Section 564(b)(1) of the Act, 21 U.S.C.section 360bbb-3(b)(1), unless the authorization is terminated  or revoked sooner.       Influenza A by PCR NEGATIVE NEGATIVE Final   Influenza B by PCR NEGATIVE NEGATIVE Final    Comment: (NOTE) The Xpert Xpress SARS-CoV-2/FLU/RSV plus assay is intended as an aid in the diagnosis of influenza from Nasopharyngeal swab specimens and should not be used as a sole basis for treatment. Nasal washings and aspirates are unacceptable for Xpert Xpress SARS-CoV-2/FLU/RSV testing.  Fact Sheet for Patients: BloggerCourse.com  Fact Sheet for Healthcare Providers: SeriousBroker.it  This test is not yet approved or cleared by the [place] FDA and has been authorized for detection and/or diagnosis of SARS-CoV-2 by FDA under an Emergency Use Authorization (EUA). This EUA will remain in effect (meaning this test can be used) for the duration of the COVID-19 declaration under Section 564(b)(1) of the Act, 21 U.S.C. section 360bbb-3(b)(1), unless the authorization is terminated or revoked.  Performed at [facility], [address] [place], [place] [address]   CSF culture w Gram Stain     Status: None   Collection Time: 02/17/21  3:11 PM   Specimen: CSF; Cerebrospinal Fluid  Result Value Ref Range Status   Specimen Description   Final    CSF Performed at [facility], [address], [place], [place] [address]    Special Requests   Final    NONE Performed at [facility], [address] [place], [place] [address]    Gram Stain   Final    CYTOSPIN SLIDE WBC SEEN NO RBC SEEN NO ORGANISMS SEEN Performed at [facility], [address] [place], [place] [address]    Culture   Final    NO  GROWTH Performed at [facility], [address], [place], [place] [address]    Report Status 02/21/2021 FINAL  Final  Resp Panel by RT-PCR (Flu A&B, Covid) Nasopharyngeal Swab     Status: None   Collection Time: 02/18/21 12:10 PM   Specimen: Nasopharyngeal Swab; Nasopharyngeal(NP) swabs in vial transport medium  Result Value Ref Range Status   SARS Coronavirus 2 by RT PCR NEGATIVE NEGATIVE Final    Comment: (NOTE) SARS-CoV-2 target nucleic acids are NOT DETECTED.  The SARS-CoV-2 RNA is generally detectable in upper respiratory specimens during the acute phase of infection. The lowest concentration of SARS-CoV-2 viral copies this assay can detect is 138 copies/mL. A negative result does not preclude SARS-Cov-2 infection and should not be used as the sole basis for treatment or other patient management  decisions. A negative result may occur with  improper specimen collection/handling, submission of specimen other than nasopharyngeal swab, presence of viral mutation(s) within the areas targeted by this assay, and inadequate number of viral copies(<138 copies/mL). A negative result must be combined with clinical observations, patient history, and epidemiological information. The expected result is Negative.  Fact Sheet for Patients:  BloggerCourse.com  Fact Sheet for Healthcare Providers:  SeriousBroker.it  This test is no t yet approved or cleared by the [place] FDA and  has been authorized for detection and/or diagnosis of SARS-CoV-2 by FDA under an Emergency Use Authorization (EUA). This EUA will remain  in effect (meaning this test can be used) for the duration of the COVID-19 declaration under Section 564(b)(1) of the Act, 21 U.S.C.section 360bbb-3(b)(1), unless the authorization is terminated  or revoked sooner.       Influenza A by PCR NEGATIVE NEGATIVE Final   Influenza B by PCR NEGATIVE NEGATIVE Final     Comment: (NOTE) The Xpert Xpress SARS-CoV-2/FLU/RSV plus assay is intended as an aid in the diagnosis of influenza from Nasopharyngeal swab specimens and should not be used as a sole basis for treatment. Nasal washings and aspirates are unacceptable for Xpert Xpress SARS-CoV-2/FLU/RSV testing.  Fact Sheet for Patients: BloggerCourse.com  Fact Sheet for Healthcare Providers: SeriousBroker.it  This test is not yet approved or cleared by the [place] FDA and has been authorized for detection and/or diagnosis of SARS-CoV-2 by FDA under an Emergency Use Authorization (EUA). This EUA will remain in effect (meaning this test can be used) for the duration of the COVID-19 declaration under Section 564(b)(1) of the Act, 21 U.S.C. section 360bbb-3(b)(1), unless the authorization is terminated or revoked.  Performed at [facility], [address] [place], [place] [address]          Radiology Studies: DG Abd 1 View  Result Date: 02/24/2021 CLINICAL DATA:  Acute pancreatitis, abdominal pain, constipation. EXAM: ABDOMEN - 1 VIEW COMPARISON:  None. FINDINGS: The bowel gas pattern is normal. No radio-opaque calculi or other significant radiographic abnormality are seen. IMPRESSION: Negative. Electronically Signed   By: Larose Hires D.O.   On: 02/24/2021 08:54        Scheduled Meds:  amLODipine  10 mg Oral Daily   carvedilol  25 mg Oral BID WC   Chlorhexidine Gluconate Cloth  6 each Topical Q0600   enoxaparin (LOVENOX) injection  40 mg Subcutaneous Q24H   multivitamin with minerals  1 tablet Oral Daily   pantoprazole (PROTONIX) IV  40 mg Intravenous Q12H   polyethylene glycol  17 g Oral BID   Continuous Infusions:  piperacillin-tazobactam (ZOSYN)  IV 3.375 g (02/24/21 0910)     LOS: 6 days    Time spent: 25 minutes    Tresa Moore, MD [facility]   If 7PM-7AM, please contact  night-coverage  02/24/2021, 3:33 PM

## 2021-02-25 LAB — CREATININE, SERUM
Creatinine, Ser: 0.39 mg/dL — ABNORMAL LOW (ref 0.44–1.00)
GFR, Estimated: >60 mL/min (ref >60)

## 2021-02-25 MED ORDER — MAGNESIUM HYDROXIDE 400 MG/5ML PO SUSP: 30.0000 mL | Freq: Every day | ORAL | 0 refills | Status: AC | PRN

## 2021-02-25 MED ORDER — AMOXICILLIN-POT CLAVULANATE 875-125 MG PO TABS
1.0000 | ORAL_TABLET | Freq: Two times a day (BID) | ORAL | Status: DC
  Administered 2021-02-25: 09:00:00 1 via ORAL
  Filled 2021-02-25: qty 1

## 2021-02-25 MED ORDER — MAGNESIUM HYDROXIDE 400 MG/5ML PO SUSP
30.0000 mL | Freq: Every day | ORAL | Status: DC | PRN
  Administered 2021-02-25: 30 mL via ORAL
  Filled 2021-02-25 (×2): qty 30

## 2021-02-25 MED ORDER — DICYCLOMINE HCL 20 MG PO TABS
20.0000 mg | ORAL_TABLET | Freq: Three times a day (TID) | ORAL | 0 refills | Status: AC
Start: 2021-02-25 — End: 2021-03-04

## 2021-02-25 MED ORDER — SIMETHICONE 80 MG PO CHEW: 80.0000 mg | CHEWABLE_TABLET | Freq: Four times a day (QID) | ORAL | 0 refills | Status: AC | PRN

## 2021-02-25 MED ORDER — ONDANSETRON HCL 4 MG PO TABS: 4.0000 mg | ORAL_TABLET | Freq: Every day | ORAL | 0 refills | Status: AC | PRN

## 2021-02-25 MED ORDER — AMOXICILLIN-POT CLAVULANATE 875-125 MG PO TABS
1.0000 | ORAL_TABLET | Freq: Two times a day (BID) | ORAL | 0 refills | Status: AC
Start: 2021-02-25 — End: 2021-02-28

## 2021-02-25 NOTE — Discharge Summary (Signed)
Physician Discharge Summary  Linda Clark [id number] DOB: [birth year] DOA: 02/18/2021  PCP: Theotis Burrow, MD  Admit date: 02/18/2021 Discharge date: 02/25/2021  Admitted From: Home Disposition: Home  Recommendations for Outpatient Follow-up:  Follow up with PCP in 1-2 weeks Follow-up general surgery 1 week  Home Health: No Equipment/Devices: None  Discharge Condition: Stable CODE STATUS: Full Diet recommendation: Regular  Brief/Interim Summary: 60 year old female with history of HTN, HLD, GERD, comes into the hospital with abdominal pain associated with vomiting.  Pain was mainly epigastric area radiating to the back.  She was found to have acute pancreatitis with a significantly elevated lipase.  Right upper quadrant ultrasound showed gallbladder sludge, underwent an MRCP which showed moderate to severe acute pancreatitis and dependent material in the gallbladder likely representing sludge and possible small stones.  CBD was mildly dilated but no choledocholithiasis was seen.  GI and general surgery was consulted and she was admitted to the hospital.  She is status postcholecystectomy on 11/26.  Drain removed 11/29.  Some abdominal pain post drain removal but overall stable.  Lipase normalized.  LFTs downtrending.  Stable for discharge home.  Follow-up PCP and general surgery    Discharge Diagnoses:  Principal Problem:   Acute pancreatitis Active Problems:   Abdominal pain   Nausea & vomiting   Elevated lipase   Hyperglycemia   Transaminitis   Biliary sludge determined by ultrasound   Hepatic steatosis   Hypertensive urgency   Obesity (BMI 30.0-34.9)   Essential hypertension   Mixed hyperlipidemia   CAD (coronary artery disease)   Dehydration  Acute pancreatitis, likely gallstone related -possibly she passed a gallstone given CBD dilation but there is no current choledocholithiasis noticed on the MRCP.  Gastroenterology and general  surgery consulted.  Patient was taken to the OR on 11/26 found to have severe acute cholecystitis with gallbladder empyema. -she was placed on Zosyn, continue, surgery recommends 7 days of antibiotics and transition to Augmentin if she discharges home -Drain removed 11/29 per general surgery, surgical team signed off. Plan: Discharge home.  At time of discharge will recommend bowel regimen, as needed Bentyl for gastric cramps.  Will complete full antibiotic course, transition to p.o. Augmentin at time of discharge.  Follow-up outpatient PCP and general surgery     Hypertensive urgency, essential hypertension-blood pressure well controlled.  Continue current regimen   Elevated LFTs-improving, outpatient follow-up   Hypokalemia-monitor and replace as necessary   Hyperlipidemia- at home she is on atorvastatin and Zetia.  Resume on discharge  Obesity, class I-BMI 31, she would benefit from weight loss  Discharge Instructions  Discharge Instructions     Diet - low sodium heart healthy   Complete by: As directed    Increase activity slowly   Complete by: As directed    No wound care   Complete by: As directed       Allergies as of 02/25/2021       Reactions   Aspirin Anaphylaxis   Aspirin-dipyridamole Er Anaphylaxis        Medication List     TAKE these medications    amLODipine 10 MG tablet Commonly known as: NORVASC Take 10 mg by mouth daily.   amoxicillin-clavulanate 875-125 MG tablet Commonly known as: AUGMENTIN Take 1 tablet by mouth every 12 (twelve) hours for 3 days.   atorvastatin 40 MG tablet Commonly known as: LIPITOR Take 40 mg by mouth at bedtime.   carvedilol 25 MG tablet Commonly known as:  COREG Take 25 mg by mouth 2 (two) times daily with a meal.   cetirizine 10 MG tablet Commonly known as: ZYRTEC Take 10 mg by mouth daily at 12 noon.   dicyclomine 20 MG tablet Commonly known as: BENTYL Take 1 tablet (20 mg total) by mouth 3 (three) times  daily before meals for 7 days.   ezetimibe 10 MG tablet Commonly known as: ZETIA Take 10 mg by mouth daily.   magnesium hydroxide 400 MG/5ML suspension Commonly known as: MILK OF MAGNESIA Take 30 mLs by mouth daily as needed for mild constipation.   nitroGLYCERIN 0.4 MG SL tablet Commonly known as: NITROSTAT Place 0.4 mg under the tongue as directed.   omeprazole 20 MG capsule Commonly known as: PRILOSEC Take 20 mg by mouth 2 (two) times daily.   ondansetron 4 MG tablet Commonly known as: Zofran Take 1 tablet (4 mg total) by mouth daily as needed for nausea or vomiting.   simethicone 80 MG chewable tablet Commonly known as: MYLICON Chew 1 tablet (80 mg total) by mouth 4 (four) times daily as needed for flatulence.   valsartan-hydrochlorothiazide 160-25 MG tablet Commonly known as: DIOVAN-HCT Take 1 tablet by mouth daily.        Follow-up Information     Pabon, [place] F, MD. Schedule an appointment as soon as possible for a visit in 3 week(s).   Specialty: General Surgery Why: 2-3 week follow up, s/p robotic assisted laparoscopic cholecystectomy Contact information: [address] [place] [address] [phone]         Revelo, Elyse Jarvis, MD. Schedule an appointment as soon as possible for a visit in 1 week(s).   Specialty: Family Medicine Contact information: [place] [address] [phone]                Allergies  Allergen Reactions   Aspirin Anaphylaxis   Aspirin-Dipyridamole Er Anaphylaxis    Consultations: General surgery GI   Procedures/Studies: CT ANGIO HEAD NECK W WO CM  Result Date: 02/17/2021 CLINICAL DATA:  Same-day noncontrast CT head EXAM: CT ANGIOGRAPHY HEAD AND NECK TECHNIQUE: Multidetector CT imaging of the head and neck was performed using the standard protocol during bolus administration of intravenous contrast. Multiplanar CT image reconstructions and MIPs were obtained to  evaluate the vascular anatomy. Carotid stenosis measurements (when applicable) are obtained utilizing NASCET criteria, using the distal internal carotid diameter as the denominator. CONTRAST:  181mL OMNIPAQUE IOHEXOL 350 MG/ML SOLN COMPARISON:  Same-day noncontrast CT head. FINDINGS: CTA NECK FINDINGS Aortic arch: The left vertebral artery arises directly from the aortic arch, a normal variant. Imaged portion shows no evidence of aneurysm or dissection. No significant stenosis of the major arch vessel origins. Right carotid system: The right common, internal, and external carotid arteries are patent, without hemodynamically significant stenosis, occlusion, dissection, or aneurysm. The right internal carotid artery has a medialized retropharyngeal course. Left carotid system: The left common, internal, and external carotid arteries are patent, without hemodynamically significant stenosis, occlusion, dissection, or aneurysm. Vertebral arteries: The right vertebral artery is dominant with a diminutive left vertebral artery., a normal variant. The vertebral arteries are patent, without hemodynamically significant stenosis, occlusion, dissection, or aneurysm. Skeleton: There is mild degenerative change at C5-C6. There is no visible canal hematoma. There is no acute osseous abnormality or aggressive osseous lesion. Other neck: The soft tissues are unremarkable. Upper chest: The imaged lung apices are clear. Review of the MIP images confirms the above findings  CTA HEAD FINDINGS Anterior circulation: The bilateral cavernous ICAs are patent with minimal plaque in the left supraclinoid segment. The bilateral MCAs are patent. The bilateral ACAs are patent. There is no aneurysm. Posterior circulation: The left V4 segment is markedly diminutive after the PICA origin, likely developmental variant. The prominent right V4 segment is patent. The basilar artery is patent. The bilateral PCAs are patent. There is no aneurysm. Venous  sinuses: Patent. Anatomic variants: None. Review of the MIP images confirms the above findings IMPRESSION: 1. No aneurysm identified. 2. Patent vasculature of the head and neck, with no hemodynamically significant stenosis, occlusion, or dissection. Electronically Signed   By: Valetta Mole M.D.   On: 02/17/2021 14:02   DG Abd 1 View  Result Date: 02/24/2021 CLINICAL DATA:  Acute pancreatitis, abdominal pain, constipation. EXAM: ABDOMEN - 1 VIEW COMPARISON:  None. FINDINGS: The bowel gas pattern is normal. No radio-opaque calculi or other significant radiographic abnormality are seen. IMPRESSION: Negative. Electronically Signed   By: Keane Police D.O.   On: 02/24/2021 08:54   CT Head Wo Contrast  Result Date: 02/17/2021 CLINICAL DATA:  Headache EXAM: CT HEAD WITHOUT CONTRAST TECHNIQUE: Contiguous axial images were obtained from the base of the skull through the vertex without intravenous contrast. COMPARISON:  None. FINDINGS: Brain: Small volume subarachnoid hemorrhage seen over the left frontal and occipital lobes. No evidence of acute infarction, hydrocephalus, extra-axial collection or mass lesion/mass effect. Vascular: No hyperdense vessel or unexpected calcification. Skull: Normal. Negative for fracture or focal lesion. Sinuses/Orbits: Mucosal thickening of the ethmoid and left maxillary sinus. No acute abnormality. Other: None. IMPRESSION: Small volume subarachnoid hemorrhage seen over the left frontal and occipital lobes. Critical Value/emergent results were called by telephone at the time of interpretation on 02/17/2021 at 12:50 pm to provider JENISE MENSHEW , who verbally acknowledged these results. Electronically Signed   By: Yetta Glassman M.D.   On: 02/17/2021 12:50   MR 3D Recon At Scanner  Result Date: 02/22/2021 CLINICAL DATA:  Abdominal pain. Two days status post cholecystectomy. Acute pancreatitis. EXAM: MRI ABDOMEN WITHOUT AND WITH CONTRAST (INCLUDING MRCP) TECHNIQUE: Multiplanar  multisequence MR imaging of the abdomen was performed both before and after the administration of intravenous contrast. Heavily T2-weighted images of the biliary and pancreatic ducts were obtained, and three-dimensional MRCP images were rendered by post processing. CONTRAST:  64mL GADAVIST GADOBUTROL 1 MMOL/ML IV SOLN COMPARISON:  02/18/2021 FINDINGS: Lower chest: New small bilateral pleural effusions and bibasilar atelectasis. Hepatobiliary: Multiple small hepatic cysts are again seen. No hepatic masses identified. Patient has undergone cholecystectomy since prior study. No evidence of biliary ductal dilatation or choledocholithiasis. No abnormal fluid collections seen within the abdomen. Pancreas: Diffuse pancreatic edema and peripancreatic fluid show improvement since previous study, consistent with improving acute pancreatitis. No evidence of pancreatic mass, necrosis, or pseudocyst. No evidence of pancreatic ductal dilatation or pancreas divisum. Spleen:  Within normal limits in size and appearance. Adrenals/Urinary Tract: No masses identified. A few simple renal cysts are noted bilaterally. A 1.3 cm Bosniak category 2 hemorrhagic cyst is also seen in the lower pole of the right kidney. No evidence of renal mass or hydronephrosis. Stomach/Bowel: Visualized portion unremarkable. Vascular/Lymphatic: No pathologically enlarged lymph nodes identified. No acute vascular findings. Other:  None. Musculoskeletal:  No suspicious bone lesions identified. IMPRESSION: Status post cholecystectomy. No evidence of biliary ductal dilatation or choledocholithiasis. Improved acute pancreatitis. No evidence of pancreatic necrosis, pseudocyst, or mass. New small bilateral pleural effusions and bibasilar atelectasis. Electronically Signed  By: Marlaine Hind M.D.   On: 02/22/2021 14:01   MR 3D Recon At Scanner  Result Date: 02/18/2021 CLINICAL DATA:  Right upper quadrant pain EXAM: MRI ABDOMEN WITHOUT AND WITH CONTRAST  (INCLUDING MRCP) TECHNIQUE: Multiplanar multisequence MR imaging of the abdomen was performed both before and after the administration of intravenous contrast. Heavily T2-weighted images of the biliary and pancreatic ducts were obtained, and three-dimensional MRCP images were rendered by post processing. CONTRAST:  7.89mL GADAVIST GADOBUTROL 1 MMOL/ML IV SOLN COMPARISON:  CT abdomen and pelvis 02/17/2021, ultrasound abdomen 02/18/2021 FINDINGS: Study is significantly limited due to motion. Lower chest: No acute findings. Hepatobiliary: Liver is upper normal size measuring 18 cm in length. There is evidence of hepatic steatosis. Several hyperintense T2 signal hepatic cysts are identified measuring up to 2.6 cm in size. Layering hypointense material in the gallbladder likely representing sludge and possible small stones. No significant gallbladder wall thickening identified. No choledocholithiasis identified. Common bile duct is mildly dilated measuring 7 mm in diameter. No significant intrahepatic ductal dilatation visualized. Pancreas: Diffuse pancreatic edema and extensive peripancreatic edema consistent with acute pancreatitis. No pancreatic mass or ductal dilatation visualized. No pseudocyst formation identified. No evidence of necrosis. Spleen:  Within normal limits in size and appearance. Adrenals/Urinary Tract: Adrenal glands appear normal. A few renal cortical cysts identified bilaterally measuring up to 2.1 cm on the left. No hydronephrosis or enhancing renal mass identified bilaterally. Stomach/Bowel: Grossly unremarkable. Vascular/Lymphatic:  No bulky lymphadenopathy visualized. Other:  Small volume ascites. Musculoskeletal: No suspicious bony lesions. IMPRESSION: 1. Moderate to severe acute pancreatitis. 2. Dependent material in the gallbladder likely representing sludge and possibly small stones. No choledocholithiasis visualized. Common bile duct is mildly dilated. 3. Hepatic steatosis. 4. Hepatic and  renal cysts. 5. Study is limited due to motion. Electronically Signed   By: Ofilia Neas M.D.   On: 02/18/2021 15:03   MR ABDOMEN MRCP W WO CONTAST  Result Date: 02/22/2021 CLINICAL DATA:  Abdominal pain. Two days status post cholecystectomy. Acute pancreatitis. EXAM: MRI ABDOMEN WITHOUT AND WITH CONTRAST (INCLUDING MRCP) TECHNIQUE: Multiplanar multisequence MR imaging of the abdomen was performed both before and after the administration of intravenous contrast. Heavily T2-weighted images of the biliary and pancreatic ducts were obtained, and three-dimensional MRCP images were rendered by post processing. CONTRAST:  68mL GADAVIST GADOBUTROL 1 MMOL/ML IV SOLN COMPARISON:  02/18/2021 FINDINGS: Lower chest: New small bilateral pleural effusions and bibasilar atelectasis. Hepatobiliary: Multiple small hepatic cysts are again seen. No hepatic masses identified. Patient has undergone cholecystectomy since prior study. No evidence of biliary ductal dilatation or choledocholithiasis. No abnormal fluid collections seen within the abdomen. Pancreas: Diffuse pancreatic edema and peripancreatic fluid show improvement since previous study, consistent with improving acute pancreatitis. No evidence of pancreatic mass, necrosis, or pseudocyst. No evidence of pancreatic ductal dilatation or pancreas divisum. Spleen:  Within normal limits in size and appearance. Adrenals/Urinary Tract: No masses identified. A few simple renal cysts are noted bilaterally. A 1.3 cm Bosniak category 2 hemorrhagic cyst is also seen in the lower pole of the right kidney. No evidence of renal mass or hydronephrosis. Stomach/Bowel: Visualized portion unremarkable. Vascular/Lymphatic: No pathologically enlarged lymph nodes identified. No acute vascular findings. Other:  None. Musculoskeletal:  No suspicious bone lesions identified. IMPRESSION: Status post cholecystectomy. No evidence of biliary ductal dilatation or choledocholithiasis. Improved  acute pancreatitis. No evidence of pancreatic necrosis, pseudocyst, or mass. New small bilateral pleural effusions and bibasilar atelectasis. Electronically Signed   By: Marlaine Hind  M.D.   On: 02/22/2021 14:01   MR ABDOMEN MRCP W WO CONTAST  Result Date: 02/18/2021 CLINICAL DATA:  Right upper quadrant pain EXAM: MRI ABDOMEN WITHOUT AND WITH CONTRAST (INCLUDING MRCP) TECHNIQUE: Multiplanar multisequence MR imaging of the abdomen was performed both before and after the administration of intravenous contrast. Heavily T2-weighted images of the biliary and pancreatic ducts were obtained, and three-dimensional MRCP images were rendered by post processing. CONTRAST:  7.795mL GADAVIST GADOBUTROL 1 MMOL/ML IV SOLN COMPARISON:  CT abdomen and pelvis 02/17/2021, ultrasound abdomen 02/18/2021 FINDINGS: Study is significantly limited due to motion. Lower chest: No acute findings. Hepatobiliary: Liver is upper normal size measuring 18 cm in length. There is evidence of hepatic steatosis. Several hyperintense T2 signal hepatic cysts are identified measuring up to 2.6 cm in size. Layering hypointense material in the gallbladder likely representing sludge and possible small stones. No significant gallbladder wall thickening identified. No choledocholithiasis identified. Common bile duct is mildly dilated measuring 7 mm in diameter. No significant intrahepatic ductal dilatation visualized. Pancreas: Diffuse pancreatic edema and extensive peripancreatic edema consistent with acute pancreatitis. No pancreatic mass or ductal dilatation visualized. No pseudocyst formation identified. No evidence of necrosis. Spleen:  Within normal limits in size and appearance. Adrenals/Urinary Tract: Adrenal glands appear normal. A few renal cortical cysts identified bilaterally measuring up to 2.1 cm on the left. No hydronephrosis or enhancing renal mass identified bilaterally. Stomach/Bowel: Grossly unremarkable. Vascular/Lymphatic:  No bulky  lymphadenopathy visualized. Other:  Small volume ascites. Musculoskeletal: No suspicious bony lesions. IMPRESSION: 1. Moderate to severe acute pancreatitis. 2. Dependent material in the gallbladder likely representing sludge and possibly small stones. No choledocholithiasis visualized. Common bile duct is mildly dilated. 3. Hepatic steatosis. 4. Hepatic and renal cysts. 5. Study is limited due to motion. Electronically Signed   By: Jannifer Hickelaney  Williams M.D.   On: 02/18/2021 15:03   CT Angio Chest/Abd/Pel for Dissection W and/or Wo Contrast  Result Date: 02/17/2021 CLINICAL DATA:  Hypertension. Small subarachnoid hemorrhage on head CT. EXAM: CT ANGIOGRAPHY CHEST, ABDOMEN AND PELVIS TECHNIQUE: Non-contrast CT of the chest was initially obtained. Multidetector CT imaging through the chest, abdomen and pelvis was performed using the standard protocol during bolus administration of intravenous contrast. Multiplanar reconstructed images and MIPs were obtained and reviewed to evaluate the vascular anatomy. CONTRAST:  125mL OMNIPAQUE IOHEXOL 350 MG/ML SOLN COMPARISON:  None. FINDINGS: CTA CHEST FINDINGS Cardiovascular: Preferential opacification of the thoracic aorta. No evidence of thoracic aortic aneurysm or dissection. Normal heart size. No pericardial effusion. No central pulmonary embolism. Mediastinum/Nodes: No enlarged mediastinal, hilar, or axillary lymph nodes. Thyroid gland, trachea, and esophagus demonstrate no significant findings. Lungs/Pleura: Lungs are clear. No pleural effusion or pneumothorax. Musculoskeletal: No chest wall abnormality. No acute or significant osseous findings. Review of the MIP images confirms the above findings. CTA ABDOMEN AND PELVIS FINDINGS VASCULAR Aorta: Normal caliber aorta without aneurysm, dissection, vasculitis or significant stenosis. Mild atherosclerotic calcification. Celiac: Patent without evidence of aneurysm, dissection, vasculitis or significant stenosis. SMA: Patent  without evidence of aneurysm, dissection, vasculitis or significant stenosis. Renals: Both renal arteries are patent without evidence of aneurysm, dissection, vasculitis, fibromuscular dysplasia or significant stenosis. IMA: Patent without evidence of aneurysm, dissection, vasculitis or significant stenosis. Inflow: Patent without evidence of aneurysm, dissection, vasculitis or significant stenosis. Veins: No obvious venous abnormality within the limitations of this arterial phase study. Review of the MIP images confirms the above findings. NON-VASCULAR Hepatobiliary: Scattered hepatic simple cysts measuring up to 2.6 cm. Other subcentimeter low-density  lesions within the liver are too small to characterize. Gallbladder sludge. No gallbladder wall thickening or biliary dilatation. Pancreas: Unremarkable. No pancreatic ductal dilatation or surrounding inflammatory changes. Spleen: Normal in size without focal abnormality. Adrenals/Urinary Tract: Adrenal glands are unremarkable. Bilateral renal simple cysts measuring up to 2.2 cm. No renal calculi or hydronephrosis. The bladder is unremarkable. Stomach/Bowel: Small hiatal hernia. The stomach is otherwise within normal limits. No bowel wall thickening, distention, or surrounding inflammatory changes. Lymphatic: No enlarged abdominal or pelvic lymph nodes. Reproductive: Uterus and bilateral adnexa are unremarkable. Other: No abdominal wall hernia or abnormality. No abdominopelvic ascites. No pneumoperitoneum. Musculoskeletal: No acute or significant osseous findings. Review of the MIP images confirms the above findings. IMPRESSION: 1. No evidence of acute aortic syndrome. No aneurysm. 2. Gallbladder sludge. 3.  Aortic atherosclerosis (ICD10-I70.0). Electronically Signed   By: Titus Dubin M.D.   On: 02/17/2021 14:03   US Abdomen Limited RUQ (LIVER/GB)  Result Date: 02/18/2021 CLINICAL DATA:  Upper abdominal pain, nausea and vomiting EXAM: ULTRASOUND ABDOMEN  LIMITED RIGHT UPPER QUADRANT COMPARISON:  CT 02/17/2021 FINDINGS: Gallbladder: Sludge is present within the gallbladder lumen. No shadowing gallstones or wall thickening visualized. No sonographic Murphy sign noted by sonographer. Common bile duct: Diameter: 4 mm. Liver: Multiple hepatic cysts within the liver, largest present within the left hepatic lobe measuring up to 2.5 cm. No focal solid liver lesion identified. Diffusely increased hepatic parenchymal echogenicity. Portal vein is patent on color Doppler imaging with normal direction of blood flow towards the liver. Other: None. IMPRESSION: 1. Gallbladder sludge.  No sonographic evidence of cholecystitis. 2. The echogenicity of the liver is increased. This is a nonspecific finding but is most commonly seen with fatty infiltration of the liver. There are no obvious solid liver lesions. 3. Multiple hepatic cysts. Electronically Signed   By: Davina Poke D.O.   On: 02/18/2021 12:35      Subjective: Seen and examined on the day of discharge.  Patient stable no distress.  Ambulating around hallway.  Abdominal pain improved.  Tolerating p.o.  Discharge Exam: Vitals:   02/25/21 0824 02/25/21 1202  BP: 130/81 120/81  Pulse: 75 81  Resp: 16 16  Temp: 98.1 F (36.7 C) 98.4 F (36.9 C)  SpO2: 94% 94%   Vitals:   02/24/21 2356 02/25/21 0520 02/25/21 0824 02/25/21 1202  BP: 110/78 121/73 130/81 120/81  Pulse: 66 65 75 81  Resp: 17 16 16 16   Temp: 98 F (36.7 C) 98.6 F (37 C) 98.1 F (36.7 C) 98.4 F (36.9 C)  TempSrc:  Oral Oral Oral  SpO2: 94% 92% 94% 94%  Weight:      Height:        General: Pt is alert, awake, not in acute distress Cardiovascular: RRR, S1/S2 +, no rubs, no gallops Respiratory: CTA bilaterally, no wheezing, no rhonchi Abdominal: Soft, NT, ND, bowel sounds + Extremities: no edema, no cyanosis    The results of significant diagnostics from this hospitalization (including imaging, microbiology, ancillary and  laboratory) are listed below for reference.     Microbiology: Recent Results (from the past 240 hour(s))  Resp Panel by RT-PCR (Flu A&B, Covid) Nasopharyngeal Swab     Status: None   Collection Time: 02/17/21  1:21 PM   Specimen: Nasopharyngeal Swab; Nasopharyngeal(NP) swabs in vial transport medium  Result Value Ref Range Status   SARS Coronavirus 2 by RT PCR NEGATIVE NEGATIVE Final    Comment: (NOTE) SARS-CoV-2 target nucleic acids are NOT DETECTED.  The SARS-CoV-2 RNA is generally detectable in upper respiratory specimens during the acute phase of infection. The lowest concentration of SARS-CoV-2 viral copies this assay can detect is 138 copies/mL. A negative result does not preclude SARS-Cov-2 infection and should not be used as the sole basis for treatment or other patient management decisions. A negative result may occur with  improper specimen collection/handling, submission of specimen other than nasopharyngeal swab, presence of viral mutation(s) within the areas targeted by this assay, and inadequate number of viral copies(<138 copies/mL). A negative result must be combined with clinical observations, patient history, and epidemiological information. The expected result is Negative.  Fact Sheet for Patients:  EntrepreneurPulse.com.au  Fact Sheet for Healthcare Providers:  IncredibleEmployment.be  This test is no t yet approved or cleared by the [place] FDA and  has been authorized for detection and/or diagnosis of SARS-CoV-2 by FDA under an Emergency Use Authorization (EUA). This EUA will remain  in effect (meaning this test can be used) for the duration of the COVID-19 declaration under Section 564(b)(1) of the Act, 21 U.S.C.section 360bbb-3(b)(1), unless the authorization is terminated  or revoked sooner.       Influenza A by PCR NEGATIVE NEGATIVE Final   Influenza B by PCR NEGATIVE NEGATIVE Final    Comment: (NOTE) The  Xpert Xpress SARS-CoV-2/FLU/RSV plus assay is intended as an aid in the diagnosis of influenza from Nasopharyngeal swab specimens and should not be used as a sole basis for treatment. Nasal washings and aspirates are unacceptable for Xpert Xpress SARS-CoV-2/FLU/RSV testing.  Fact Sheet for Patients: EntrepreneurPulse.com.au  Fact Sheet for Healthcare Providers: IncredibleEmployment.be  This test is not yet approved or cleared by the [place] FDA and has been authorized for detection and/or diagnosis of SARS-CoV-2 by FDA under an Emergency Use Authorization (EUA). This EUA will remain in effect (meaning this test can be used) for the duration of the COVID-19 declaration under Section 564(b)(1) of the Act, 21 U.S.C. section 360bbb-3(b)(1), unless the authorization is terminated or revoked.  Performed at [facility], [address] [place], [place] [address]   CSF culture w Gram Stain     Status: None   Collection Time: 02/17/21  3:11 PM   Specimen: CSF; Cerebrospinal Fluid  Result Value Ref Range Status   Specimen Description   Final    CSF Performed at [facility], [address], [place], [place] [address]    Special Requests   Final    NONE Performed at [facility], [place], [place] [address]    Gram Stain   Final    CYTOSPIN SLIDE WBC SEEN NO RBC SEEN NO ORGANISMS SEEN Performed at [facility], [address] [place], [place] [address]    Culture   Final    NO GROWTH Performed at [facility], [place] [address] [place],  [address]    Report Status 02/21/2021 FINAL  Final  Resp Panel by RT-PCR (Flu A&B, Covid) Nasopharyngeal Swab     Status: None   Collection Time: 02/18/21 12:10 PM   Specimen: Nasopharyngeal Swab; Nasopharyngeal(NP) swabs in vial transport medium  Result Value Ref Range Status   SARS Coronavirus 2 by RT PCR NEGATIVE NEGATIVE Final     Comment: (NOTE) SARS-CoV-2 target nucleic acids are NOT DETECTED.  The SARS-CoV-2 RNA is generally detectable in upper respiratory specimens during the acute phase of infection. The lowest concentration of SARS-CoV-2 viral copies this assay can detect is 138  copies/mL. A negative result does not preclude SARS-Cov-2 infection and should not be used as the sole basis for treatment or other patient management decisions. A negative result may occur with  improper specimen collection/handling, submission of specimen other than nasopharyngeal swab, presence of viral mutation(s) within the areas targeted by this assay, and inadequate number of viral copies(<138 copies/mL). A negative result must be combined with clinical observations, patient history, and epidemiological information. The expected result is Negative.  Fact Sheet for Patients:  EntrepreneurPulse.com.au  Fact Sheet for Healthcare Providers:  IncredibleEmployment.be  This test is no t yet approved or cleared by the [place] FDA and  has been authorized for detection and/or diagnosis of SARS-CoV-2 by FDA under an Emergency Use Authorization (EUA). This EUA will remain  in effect (meaning this test can be used) for the duration of the COVID-19 declaration under Section 564(b)(1) of the Act, 21 U.S.C.section 360bbb-3(b)(1), unless the authorization is terminated  or revoked sooner.       Influenza A by PCR NEGATIVE NEGATIVE Final   Influenza B by PCR NEGATIVE NEGATIVE Final    Comment: (NOTE) The Xpert Xpress SARS-CoV-2/FLU/RSV plus assay is intended as an aid in the diagnosis of influenza from Nasopharyngeal swab specimens and should not be used as a sole basis for treatment. Nasal washings and aspirates are unacceptable for Xpert Xpress SARS-CoV-2/FLU/RSV testing.  Fact Sheet for Patients: EntrepreneurPulse.com.au  Fact Sheet for Healthcare  Providers: IncredibleEmployment.be  This test is not yet approved or cleared by the [place] FDA and has been authorized for detection and/or diagnosis of SARS-CoV-2 by FDA under an Emergency Use Authorization (EUA). This EUA will remain in effect (meaning this test can be used) for the duration of the COVID-19 declaration under Section 564(b)(1) of the Act, 21 U.S.C. section 360bbb-3(b)(1), unless the authorization is terminated or revoked.  Performed at [facility], [address] [place], [place] [address]      Labs: BNP (last 3 results) No results for input(s): BNP in the last 8760 hours. Basic Metabolic Panel: Recent Labs  Lab 02/19/21 0416 02/20/21 0415 02/21/21 1225 02/22/21 0436 02/23/21 0507 02/24/21 0535 02/25/21 0428  NA 138 138 138 138 136 135  --   K 3.3* 3.3* 3.3* 3.9 3.2* 3.3*  --   CL 103 102 103 103 99 102  --   CO2 27 29 30 31 30 27   --   GLUCOSE 122* 109* 133* 137* 95 101*  --   BUN 18 15 16 14 12 10   --   CREATININE 0.43* 0.49 0.40* 0.36* 0.40* 0.40* 0.39*  CALCIUM 8.8* 8.5* 8.1* 7.8* 7.9* 8.0*  --   MG 2.4  --   --   --   --   --   --   PHOS 3.6  --   --   --   --   --   --    Liver Function Tests: Recent Labs  Lab 02/20/21 0415 02/21/21 1225 02/22/21 0436 02/23/21 0507 02/24/21 0535  AST 48* 43* 262* 88* 24  ALT 151* 99* 213* 190* 113*  ALKPHOS 58 45 130* 124 89  BILITOT 1.5* 0.8 2.3* 1.5* 1.1  PROT 7.0 6.2* 6.1* 6.0* 5.9*  ALBUMIN 3.9 3.0* 2.9* 2.8* 2.7*   Recent Labs  Lab 02/19/21 1432 02/20/21 0415 02/22/21 0436 02/24/21 0535  LIPASE 978* 144* 589* 44   No results for input(s): AMMONIA in the last 168 hours. CBC: Recent Labs  Lab 02/19/21 0416 02/21/21  1225 02/22/21 0436 02/23/21 0507 02/24/21 0535  WBC 14.0* 11.3* 9.3 10.4 10.5  HGB 15.6* 12.5 13.6 12.5 12.6  HCT 48.0* 39.0 42.0 39.0 38.9  MCV 92.8 93.5 93.3 95.6 92.4  PLT 291 244 227 250 272   Cardiac Enzymes: No results for  input(s): CKTOTAL, CKMB, CKMBINDEX, TROPONINI in the last 168 hours. BNP: Invalid input(s): POCBNP CBG: No results for input(s): GLUCAP in the last 168 hours. D-Dimer No results for input(s): DDIMER in the last 72 hours. Hgb A1c No results for input(s): HGBA1C in the last 72 hours. Lipid Profile No results for input(s): CHOL, HDL, LDLCALC, TRIG, CHOLHDL, LDLDIRECT in the last 72 hours. Thyroid function studies No results for input(s): TSH, T4TOTAL, T3FREE, THYROIDAB in the last 72 hours.  Invalid input(s): FREET3 Anemia work up No results for input(s): VITAMINB12, FOLATE, FERRITIN, TIBC, IRON, RETICCTPCT in the last 72 hours. Urinalysis    Component Value Date/Time   COLORURINE AMBER (A) 02/18/2021 1005   APPEARANCEUR CLOUDY (A) 02/18/2021 1005   LABSPEC 1.015 02/18/2021 1005   PHURINE 5.0 02/18/2021 1005   GLUCOSEU NEGATIVE 02/18/2021 1005   HGBUR SMALL (A) 02/18/2021 1005   BILIRUBINUR SMALL (A) 02/18/2021 1005   KETONESUR NEGATIVE 02/18/2021 1005   PROTEINUR NEGATIVE 02/18/2021 1005   NITRITE NEGATIVE 02/18/2021 1005   LEUKOCYTESUR NEGATIVE 02/18/2021 1005   Sepsis Labs Invalid input(s): PROCALCITONIN,  WBC,  LACTICIDVEN Microbiology Recent Results (from the past 240 hour(s))  Resp Panel by RT-PCR (Flu A&B, Covid) Nasopharyngeal Swab     Status: None   Collection Time: 02/17/21  1:21 PM   Specimen: Nasopharyngeal Swab; Nasopharyngeal(NP) swabs in vial transport medium  Result Value Ref Range Status   SARS Coronavirus 2 by RT PCR NEGATIVE NEGATIVE Final    Comment: (NOTE) SARS-CoV-2 target nucleic acids are NOT DETECTED.  The SARS-CoV-2 RNA is generally detectable in upper respiratory specimens during the acute phase of infection. The lowest concentration of SARS-CoV-2 viral copies this assay can detect is 138 copies/mL. A negative result does not preclude SARS-Cov-2 infection and should not be used as the sole basis for treatment or other patient management  decisions. A negative result may occur with  improper specimen collection/handling, submission of specimen other than nasopharyngeal swab, presence of viral mutation(s) within the areas targeted by this assay, and inadequate number of viral copies(<138 copies/mL). A negative result must be combined with clinical observations, patient history, and epidemiological information. The expected result is Negative.  Fact Sheet for Patients:  EntrepreneurPulse.com.au  Fact Sheet for Healthcare Providers:  IncredibleEmployment.be  This test is no t yet approved or cleared by the [place] FDA and  has been authorized for detection and/or diagnosis of SARS-CoV-2 by FDA under an Emergency Use Authorization (EUA). This EUA will remain  in effect (meaning this test can be used) for the duration of the COVID-19 declaration under Section 564(b)(1) of the Act, 21 U.S.C.section 360bbb-3(b)(1), unless the authorization is terminated  or revoked sooner.       Influenza A by PCR NEGATIVE NEGATIVE Final   Influenza B by PCR NEGATIVE NEGATIVE Final    Comment: (NOTE) The Xpert Xpress SARS-CoV-2/FLU/RSV plus assay is intended as an aid in the diagnosis of influenza from Nasopharyngeal swab specimens and should not be used as a sole basis for treatment. Nasal washings and aspirates are unacceptable for Xpert Xpress SARS-CoV-2/FLU/RSV testing.  Fact Sheet for Patients: EntrepreneurPulse.com.au  Fact Sheet for Healthcare Providers: IncredibleEmployment.be  This test is not yet approved or  cleared by the [place] and has been authorized for detection and/or diagnosis of SARS-CoV-2 by FDA under an Emergency Use Authorization (EUA). This EUA will remain in effect (meaning this test can be used) for the duration of the COVID-19 declaration under Section 564(b)(1) of the Act, 21 U.S.C. section 360bbb-3(b)(1), unless the  authorization is terminated or revoked.  Performed at [facility], [address] [place], [place] [address]   CSF culture w Gram Stain     Status: None   Collection Time: 02/17/21  3:11 PM   Specimen: CSF; Cerebrospinal Fluid  Result Value Ref Range Status   Specimen Description   Final    CSF Performed at [facility], [address] [place], [place] [address]    Special Requests   Final    NONE Performed at [facility], [address] [place], [place] [address]    Gram Stain   Final    CYTOSPIN SLIDE WBC SEEN NO RBC SEEN NO ORGANISMS SEEN Performed at [facility], [address] [place], [place] [address]    Culture   Final    NO GROWTH Performed at [facility], [address] [place], [place] [address]    Report Status 02/21/2021 FINAL  Final  Resp Panel by RT-PCR (Flu A&B, Covid) Nasopharyngeal Swab     Status: None   Collection Time: 02/18/21 12:10 PM   Specimen: Nasopharyngeal Swab; Nasopharyngeal(NP) swabs in vial transport medium  Result Value Ref Range Status   SARS Coronavirus 2 by RT PCR NEGATIVE NEGATIVE Final    Comment: (NOTE) SARS-CoV-2 target nucleic acids are NOT DETECTED.  The SARS-CoV-2 RNA is generally detectable in upper respiratory specimens during the acute phase of infection. The lowest concentration of SARS-CoV-2 viral copies this assay can detect is 138 copies/mL. A negative result does not preclude SARS-Cov-2 infection and should not be used as the sole basis for treatment or other patient management decisions. A negative result may occur with  improper specimen collection/handling, submission of specimen other than nasopharyngeal swab, presence of viral mutation(s) within the areas targeted by this assay, and inadequate number of viral copies(<138 copies/mL). A negative result must be combined with clinical observations, patient history, and epidemiological information. The expected  result is Negative.  Fact Sheet for Patients:  BloggerCourse.com  Fact Sheet for Healthcare Providers:  SeriousBroker.it  This test is no t yet approved or cleared by the [place] FDA and  has been authorized for detection and/or diagnosis of SARS-CoV-2 by FDA under an Emergency Use Authorization (EUA). This EUA will remain  in effect (meaning this test can be used) for the duration of the COVID-19 declaration under Section 564(b)(1) of the Act, 21 U.S.C.section 360bbb-3(b)(1), unless the authorization is terminated  or revoked sooner.       Influenza A by PCR NEGATIVE NEGATIVE Final   Influenza B by PCR NEGATIVE NEGATIVE Final    Comment: (NOTE) The Xpert Xpress SARS-CoV-2/FLU/RSV plus assay is intended as an aid in the diagnosis of influenza from Nasopharyngeal swab specimens and should not be used as a sole basis for treatment. Nasal washings and aspirates are unacceptable for Xpert Xpress SARS-CoV-2/FLU/RSV testing.  Fact Sheet for Patients: BloggerCourse.com  Fact Sheet for Healthcare Providers: SeriousBroker.it  This test is not yet approved or cleared by the [place] FDA and has been authorized for detection and/or diagnosis of SARS-CoV-2 by FDA under an Emergency Use Authorization (EUA). This EUA will remain in  effect (meaning this test can be used) for the duration of the COVID-19 declaration under Section 564(b)(1) of the Act, 21 U.S.C. section 360bbb-3(b)(1), unless the authorization is terminated or revoked.  Performed at [facility], [address] [place], [place] [address]      Time coordinating discharge: Over 30 minutes  SIGNED:   Sidney Ace, MD  [facility] 02/25/2021, 2:13 PM Pager   If 7PM-7AM, please contact night-coverage

## 2021-02-25 NOTE — Plan of Care (Signed)
Pt orientedx4, VSS, pain addressed with PRN oxycodone per MAR. IV ABX infused w/o issue. Ambulatory to bathroom, no BM overnight but reports feeling like it is moving, passing flatus. Lap-choley sites CDI. Fall/safety precautions in place, rounding performed, needs/concerns addressed during shift.   Problem: Education: Goal: Knowledge of General Education information will improve Description: Including pain rating scale, medication(s)/side effects and non-pharmacologic comfort measures Outcome: Progressing   Problem: Health Behavior/Discharge Planning: Goal: Ability to manage health-related needs will improve Outcome: Progressing   Problem: Clinical Measurements: Goal: Ability to maintain clinical measurements within normal limits will improve Outcome: Progressing Goal: Will remain free from infection Outcome: Progressing Goal: Diagnostic test results will improve Outcome: Progressing Goal: Respiratory complications will improve Outcome: Progressing Goal: Cardiovascular complication will be avoided Outcome: Progressing   Problem: Nutrition: Goal: Adequate nutrition will be maintained Outcome: Progressing   Problem: Activity: Goal: Risk for activity intolerance will decrease Outcome: Progressing   Problem: Elimination: Goal: Will not experience complications related to bowel motility Outcome: Progressing Goal: Will not experience complications related to urinary retention Outcome: Progressing   Problem: Coping: Goal: Level of anxiety will decrease Outcome: Progressing   Problem: Pain Managment: Goal: General experience of comfort will improve Outcome: Progressing   Problem: Safety: Goal: Ability to remain free from injury will improve Outcome: Progressing   Problem: Skin Integrity: Goal: Risk for impaired skin integrity will decrease Outcome: Progressing

## 2021-03-15 ENCOUNTER — Other Ambulatory Visit: Payer: Self-pay

## 2021-03-15 ENCOUNTER — Ambulatory Visit (INDEPENDENT_AMBULATORY_CARE_PROVIDER_SITE_OTHER): Payer: Self-pay | Admitting: Surgery

## 2021-03-15 ENCOUNTER — Encounter: Payer: Self-pay | Admitting: Surgery

## 2021-03-15 VITALS — BP 131/92 | HR 93 | Temp 97.8°F | Ht 63.0 in | Wt 162.8 lb

## 2021-03-15 DIAGNOSIS — Z09 Encounter for follow-up examination after completed treatment for conditions other than malignant neoplasm: Secondary | ICD-10-CM

## 2021-03-15 NOTE — Patient Instructions (Addendum)
probar diferentes alimentos para ver lo que su cuerpo tolerar haga un seguimiento con nosotros si es necesario.  Colecistectoma mnimamente invasiva, cuidados posteriores Minimally Invasive Cholecystectomy, Care After La siguiente informacin ofrece orientacin sobre cmo cuidarse despus del procedimiento. El mdico tambin podr darle instrucciones ms especficas. Comunquese con el mdico si tiene problemas o preguntas. Qu puedo esperar despus del procedimiento? Despus del procedimiento, es comn tener los siguientes sntomas: Dolor en los lugares de la incisin. Le darn analgsicos para Human resources officer. Nuseas o vmitos leves. Meteorismo y Designer, fashion/clothing en el hombro debido al gas que se [place] durante el procedimiento. Siga estas instrucciones en su casa: Medicamentos Use los medicamentos de venta libre y los recetados solamente como se lo haya indicado el mdico. Si le recetaron un antibitico, tmelo como se lo haya indicado el mdico. No deje de usar el antibitico aunque comience a Actor. Pregntele al mdico si el medicamento recetado: Requiere que evite conducir o usar maquinaria. Puede causarle estreimiento. Es posible que tenga que tomar estas medidas para prevenir o tratar el estreimiento: Product manager suficiente lquido como para Pharmacologist la orina de color amarillo plido. Usar medicamentos recetados o de Sales promotion account executive. Consumir alimentos ricos en fibra, como frijoles, cereales integrales, y frutas y verduras frescas. Limitar su consumo de alimentos ricos en grasa y azcares procesados, como los alimentos fritos o dulces. Cuidado de la incisin  Siga las instrucciones del mdico acerca del cuidado de las incisiones. Asegrese de hacer lo siguiente: Lvese las manos con agua y jabn durante al menos 20 segundos antes y despus de cambiarse la venda (vendaje). Use desinfectante para manos si no dispone de [place] y [place]. Cambie el vendaje como se lo haya indicado  el mdico. No retire los puntos (suturas), la goma para cerrar la piel o las tiras [place]. Es posible que estos cierres cutneos deban permanecer puestos en la piel durante 2 semanas o ms. Si los bordes de las tiras [address] empiezan a despegarse y Scientific laboratory technician, puede recortar los que estn sueltos. No retire las tiras [employer] por completo a menos que el mdico se lo indique. No tome baos de inmersin, no nade ni use el jacuzzi hasta que el mdico lo autorice. Pregntele al mdico si puede ducharse. Delle Reining solo le permitan darse baos de [place]. Controle la zona de la incisin todos los [address]  [address] para detectar signos de infeccin. Est atento a los siguientes signos: Aumento del enrojecimiento, la hinchazn o Chief Technology Officer. Lquido o sangre. Calor. Pus o mal olor. Actividad Haga reposo como se lo haya indicado el mdico. No realice actividades que requieran mucho esfuerzo. Evite estar sentado durante largos perodos sin moverse. Levntese y camine un poco cada 1 a 2 horas. Esto es importante para mejorar el flujo sanguneo y la respiracin. Pida ayuda si se siente dbil o inestable. No levante ningn objeto que pese ms de 10 libras (4.5 kg) o que supere el lmite de peso que le hayan indicado, Teacher, adult education que el mdico le diga que puede [place]. No practique deportes de contacto hasta que el mdico lo autorice. No regrese al Aleen Campi o a la escuela hasta que el mdico lo autorice. Retome sus actividades normales como se lo haya indicado el mdico. Pregntele al mdico qu actividades son seguras para usted. Instrucciones generales Si le administraron un sedante durante el procedimiento, puede afectarlo por varias horas. No conduzca ni opere maquinaria hasta que el mdico le indique que es seguro [place]. Concurra a todas las visitas de seguimiento. Esto  es importante. Comunquese con un mdico si: Presenta una erupcin cutnea. Aumentan el enrojecimiento, la hinchazn o el dolor alrededor de las  incisiones. Le sale lquido o sangre de las incisiones. Las incisiones estn calientes al tacto. Tiene pus o percibe que sale mal olor del lugar de las incisiones. Tiene fiebre. Una o ms de las incisiones se abren. Solicite ayuda de inmediato si: Tiene dificultad para respirar. Siente dolor en el pecho. Siente un aumento del dolor en los hombros. Se desmaya o se siente mareado al ponerse de pie. Siente un dolor intenso en el abdomen. Tiene nuseas o vmitos durante ms de Civil engineer, contracting. Tiene dolor en la pierna que es nuevo o inusual, o se localiza en un lugar especfico. Estos sntomas pueden representar un problema grave que constituye Radio broadcast assistant. No espere a ver si los sntomas desaparecen. Solicite atencin mdica de inmediato. Comunquese con el servicio de emergencias de su localidad (911 en los [place]). No conduzca por sus propios medios [employer]. Resumen Despus de este procedimiento, es comn Surveyor, mining en los lugares de las incisiones. Tambin puede tener nuseas o meteorismo. Siga las instrucciones del mdico acerca de los medicamentos, la restriccin de actividades y el cuidado de las zonas de las incisiones. No realice actividades que requieran mucho esfuerzo. Comunquese con el mdico si tiene fiebre u otros signos de infeccin, como ms enrojecimiento, hinchazn o dolor alrededor [employer]. Busque ayuda de inmediato si tiene dolor de pecho, Engineer, mining en los hombros que va en aumento o problemas para Industrial/product designer. Esta informacin no tiene Theme park manager el consejo del mdico. Asegrese de hacerle al mdico cualquier pregunta que tenga. Document Revised: 09/30/2020 Document Reviewed: 09/30/2020 Elsevier Patient Education  2022 [employer].

## 2021-03-16 ENCOUNTER — Encounter: Payer: Self-pay | Admitting: Surgery

## 2021-03-16 NOTE — Progress Notes (Signed)
Linda Clark is a 60 year old female 3 weeks out from robotic cholecystectomy for gallstone pancreatitis.  She continues to improve.  No fevers no chills taking p.o. she endorses some back pain. She Is ambulating and taking p.o. no jaundice. Path d/w pt in detail  PE  ABd: soft, nt, incisions c/d/I no infection  A/p Doing well without complication. Wishes to see me 1 more time in about 3 months to make sure everything is okay.

## 2021-06-14 ENCOUNTER — Ambulatory Visit: Payer: Self-pay | Admitting: Surgery
